# Patient Record
Sex: Female | Born: 1955 | Race: Black or African American | Hispanic: No | Smoking: Never smoker
Health system: Southern US, Community
[De-identification: ages and names within clinical notes are randomized; demographics above are authoritative.]

## PROBLEM LIST (undated history)

## (undated) DIAGNOSIS — I10 Essential (primary) hypertension: Secondary | ICD-10-CM

## (undated) DIAGNOSIS — E119 Type 2 diabetes mellitus without complications: Secondary | ICD-10-CM

---

## 1998-07-20 DIAGNOSIS — E119 Type 2 diabetes mellitus without complications: Secondary | ICD-10-CM | POA: Insufficient documentation

## 1998-07-20 DIAGNOSIS — I10 Essential (primary) hypertension: Secondary | ICD-10-CM | POA: Insufficient documentation

## 2006-07-20 DIAGNOSIS — F319 Bipolar disorder, unspecified: Secondary | ICD-10-CM | POA: Insufficient documentation

## 2008-06-25 DIAGNOSIS — J984 Other disorders of lung: Secondary | ICD-10-CM | POA: Insufficient documentation

## 2008-06-29 ENCOUNTER — Emergency Department (HOSPITAL_COMMUNITY): Admission: EM | Admit: 2008-06-29 | Discharge: 2008-06-29 | Payer: Self-pay | Admitting: Emergency Medicine

## 2008-06-29 LAB — CONVERTED CEMR LAB
Calcium: 1.21 mg/dL
Glucose, Bld: 221 mg/dL
HCT: 43.2 %
Hemoglobin: 14.1 g/dL
Monocytes Absolute: 0.4 10*3/uL
Neutro Abs: 3 10*3/uL
Neutrophils Relative %: 64 %
RBC: 5.01 M/uL
RDW: 15.7 %

## 2008-07-25 ENCOUNTER — Encounter (INDEPENDENT_AMBULATORY_CARE_PROVIDER_SITE_OTHER): Payer: Self-pay | Admitting: Nurse Practitioner

## 2008-07-26 ENCOUNTER — Ambulatory Visit: Payer: Self-pay | Admitting: Nurse Practitioner

## 2008-07-26 DIAGNOSIS — G5790 Unspecified mononeuropathy of unspecified lower limb: Secondary | ICD-10-CM | POA: Insufficient documentation

## 2008-07-26 DIAGNOSIS — L80 Vitiligo: Secondary | ICD-10-CM | POA: Insufficient documentation

## 2008-07-26 LAB — CONVERTED CEMR LAB: Blood Glucose, Fingerstick: 171

## 2008-08-03 ENCOUNTER — Telehealth (INDEPENDENT_AMBULATORY_CARE_PROVIDER_SITE_OTHER): Payer: Self-pay | Admitting: *Deleted

## 2008-08-10 ENCOUNTER — Ambulatory Visit: Payer: Self-pay | Admitting: *Deleted

## 2008-09-18 ENCOUNTER — Telehealth (INDEPENDENT_AMBULATORY_CARE_PROVIDER_SITE_OTHER): Payer: Self-pay | Admitting: Nurse Practitioner

## 2008-10-10 ENCOUNTER — Ambulatory Visit: Payer: Self-pay | Admitting: Nurse Practitioner

## 2008-10-10 LAB — CONVERTED CEMR LAB
Bilirubin Urine: NEGATIVE
Nitrite: NEGATIVE
Specific Gravity, Urine: >=1.03
Urobilinogen, UA: NEGATIVE

## 2008-10-16 ENCOUNTER — Encounter (INDEPENDENT_AMBULATORY_CARE_PROVIDER_SITE_OTHER): Payer: Self-pay | Admitting: Nurse Practitioner

## 2008-10-16 DIAGNOSIS — A5601 Chlamydial cystitis and urethritis: Secondary | ICD-10-CM | POA: Insufficient documentation

## 2008-10-16 LAB — CONVERTED CEMR LAB
Basophils Relative: 0 % (ref 0–1)
Chlamydia, DNA Probe: POSITIVE — AB
Cholesterol: 220 mg/dL — ABNORMAL HIGH (ref 0–200)
Eosinophils Relative: 5 % (ref 0–5)
GC Probe Amp, Genital: NEGATIVE
HCT: 40.1 % (ref 36.0–46.0)
HDL: 52 mg/dL (ref >39)
Hemoglobin: 13.4 g/dL (ref 12.0–15.0)
Lymphocytes Relative: 29 % (ref 12–46)
Lymphs Abs: 1.5 10*3/uL (ref 0.7–4.0)
MCHC: 33.4 g/dL (ref 30.0–36.0)
Neutro Abs: 3 10*3/uL (ref 1.7–7.7)
Neutrophils Relative %: 58 % (ref 43–77)
RBC: 4.85 M/uL (ref 3.87–5.11)
Total CHOL/HDL Ratio: 4.2
Total Protein: 8.6 g/dL — ABNORMAL HIGH (ref 6.0–8.3)
VLDL: 20 mg/dL (ref 0–40)

## 2009-04-15 ENCOUNTER — Ambulatory Visit: Payer: Self-pay | Admitting: Nurse Practitioner

## 2009-04-15 DIAGNOSIS — R141 Gas pain: Secondary | ICD-10-CM | POA: Insufficient documentation

## 2009-04-15 DIAGNOSIS — R142 Eructation: Secondary | ICD-10-CM

## 2009-04-15 DIAGNOSIS — M545 Low back pain, unspecified: Secondary | ICD-10-CM | POA: Insufficient documentation

## 2009-04-15 DIAGNOSIS — R143 Flatulence: Secondary | ICD-10-CM

## 2009-04-15 DIAGNOSIS — Z8639 Personal history of other endocrine, nutritional and metabolic disease: Secondary | ICD-10-CM

## 2009-04-15 DIAGNOSIS — L84 Corns and callosities: Secondary | ICD-10-CM | POA: Insufficient documentation

## 2009-04-15 DIAGNOSIS — F341 Dysthymic disorder: Secondary | ICD-10-CM | POA: Insufficient documentation

## 2009-04-15 DIAGNOSIS — Z862 Personal history of diseases of the blood and blood-forming organs and certain disorders involving the immune mechanism: Secondary | ICD-10-CM | POA: Insufficient documentation

## 2009-04-15 DIAGNOSIS — E7800 Pure hypercholesterolemia, unspecified: Secondary | ICD-10-CM | POA: Insufficient documentation

## 2009-04-15 LAB — CONVERTED CEMR LAB
BUN: 14 mg/dL (ref 6–23)
Bilirubin Urine: NEGATIVE
Blood Glucose, Fingerstick: 204
Blood in Urine, dipstick: NEGATIVE
CA 125: 3 units/mL (ref 0.0–30.2)
CO2: 27 meq/L (ref 19–32)
Chlamydia, Swab/Urine, PCR: NEGATIVE
Chloride: 104 meq/L (ref 96–112)
Cholesterol, target level: 200 mg/dL
Glucose, Bld: 213 mg/dL — ABNORMAL HIGH (ref 70–99)
Glucose, Urine, Semiquant: NEGATIVE
HCV Ab: NEGATIVE
HDL goal, serum: 40 mg/dL
Hep A Total Ab: NEGATIVE
Hep B E Ab: NEGATIVE
Ketones, urine, test strip: NEGATIVE
LDL Goal: 100 mg/dL
Nitrite: NEGATIVE
Potassium: 4.8 meq/L (ref 3.5–5.3)
Sodium: 142 meq/L (ref 135–145)
Specific Gravity, Urine: >=1.03
Total Bilirubin: 0.6 mg/dL (ref 0.3–1.2)
Urobilinogen, UA: 1
VLDL: 16 mg/dL (ref 0–40)
WBC Urine, dipstick: NEGATIVE
pH: 5

## 2009-04-18 ENCOUNTER — Encounter (INDEPENDENT_AMBULATORY_CARE_PROVIDER_SITE_OTHER): Payer: Self-pay | Admitting: Nurse Practitioner

## 2009-04-29 ENCOUNTER — Ambulatory Visit: Payer: Self-pay | Admitting: Nurse Practitioner

## 2009-05-09 ENCOUNTER — Telehealth (INDEPENDENT_AMBULATORY_CARE_PROVIDER_SITE_OTHER): Payer: Self-pay | Admitting: Nurse Practitioner

## 2009-05-22 ENCOUNTER — Encounter (INDEPENDENT_AMBULATORY_CARE_PROVIDER_SITE_OTHER): Payer: Self-pay | Admitting: *Deleted

## 2009-05-24 ENCOUNTER — Ambulatory Visit: Payer: Self-pay | Admitting: Nurse Practitioner

## 2009-05-24 DIAGNOSIS — R252 Cramp and spasm: Secondary | ICD-10-CM | POA: Insufficient documentation

## 2009-07-23 ENCOUNTER — Ambulatory Visit: Payer: Self-pay | Admitting: Nurse Practitioner

## 2009-07-23 LAB — CONVERTED CEMR LAB
Blood Glucose, Fingerstick: 252
Hgb A1c MFr Bld: 7.6 % — ABNORMAL HIGH (ref 4.6–6.1)

## 2009-07-25 ENCOUNTER — Encounter (INDEPENDENT_AMBULATORY_CARE_PROVIDER_SITE_OTHER): Payer: Self-pay | Admitting: Nurse Practitioner

## 2009-08-29 ENCOUNTER — Ambulatory Visit: Payer: Self-pay | Admitting: Nurse Practitioner

## 2009-09-19 ENCOUNTER — Encounter (INDEPENDENT_AMBULATORY_CARE_PROVIDER_SITE_OTHER): Payer: Self-pay | Admitting: Nurse Practitioner

## 2009-11-19 ENCOUNTER — Ambulatory Visit: Payer: Self-pay | Admitting: Nurse Practitioner

## 2009-11-19 LAB — CONVERTED CEMR LAB
Bilirubin Urine: NEGATIVE
Blood Glucose, Fingerstick: 258
Glucose, Urine, Semiquant: 250
Hgb A1c MFr Bld: 10.1 %
OCCULT 1: NEGATIVE
Specific Gravity, Urine: >=1.03

## 2009-11-22 ENCOUNTER — Encounter (INDEPENDENT_AMBULATORY_CARE_PROVIDER_SITE_OTHER): Payer: Self-pay | Admitting: Nurse Practitioner

## 2009-11-22 LAB — CONVERTED CEMR LAB: Pap Smear: NEGATIVE

## 2009-12-04 ENCOUNTER — Ambulatory Visit: Payer: Self-pay | Admitting: Nurse Practitioner

## 2009-12-05 ENCOUNTER — Telehealth (INDEPENDENT_AMBULATORY_CARE_PROVIDER_SITE_OTHER): Payer: Self-pay | Admitting: Nurse Practitioner

## 2009-12-05 ENCOUNTER — Encounter (INDEPENDENT_AMBULATORY_CARE_PROVIDER_SITE_OTHER): Payer: Self-pay | Admitting: *Deleted

## 2009-12-12 ENCOUNTER — Encounter (INDEPENDENT_AMBULATORY_CARE_PROVIDER_SITE_OTHER): Payer: Self-pay | Admitting: Nurse Practitioner

## 2009-12-12 LAB — CONVERTED CEMR LAB
Albumin: 4.5 g/dL (ref 3.5–5.2)
Alkaline Phosphatase: 89 units/L (ref 39–117)
BUN: 14 mg/dL (ref 6–23)
Basophils Absolute: 0 10*3/uL (ref 0.0–0.1)
Basophils Relative: 0 % (ref 0–1)
CO2: 25 meq/L (ref 19–32)
Eosinophils Relative: 5 % (ref 0–5)
Glucose, Bld: 264 mg/dL — ABNORMAL HIGH (ref 70–99)
HCT: 42.7 % (ref 36.0–46.0)
Hemoglobin: 13.6 g/dL (ref 12.0–15.0)
Lymphocytes Relative: 21 % (ref 12–46)
MCHC: 31.9 g/dL (ref 30.0–36.0)
Microalb, Ur: 18.69 mg/dL — ABNORMAL HIGH (ref 0.00–1.89)
Monocytes Absolute: 0.4 10*3/uL (ref 0.1–1.0)
Monocytes Relative: 6 % (ref 3–12)
Neutro Abs: 3.7 10*3/uL (ref 1.7–7.7)
Potassium: 4.1 meq/L (ref 3.5–5.3)
RBC: 4.95 M/uL (ref 3.87–5.11)
RDW: 16.4 % — ABNORMAL HIGH (ref 11.5–15.5)
Sodium: 135 meq/L (ref 135–145)
TSH: 3.523 microintl units/mL (ref 0.350–4.500)
Total Protein: 8.8 g/dL — ABNORMAL HIGH (ref 6.0–8.3)

## 2009-12-19 ENCOUNTER — Encounter (INDEPENDENT_AMBULATORY_CARE_PROVIDER_SITE_OTHER): Payer: Self-pay | Admitting: Nurse Practitioner

## 2010-01-02 ENCOUNTER — Ambulatory Visit: Payer: Self-pay | Admitting: Nurse Practitioner

## 2010-01-04 IMAGING — CR DG CHEST 2V
2 series · 2 of 2 positions shown · non-contrast
Comparison: None available.

CLINICAL DATA: Shortness of breath.

CHEST - 2 VIEW

[w chest pa]
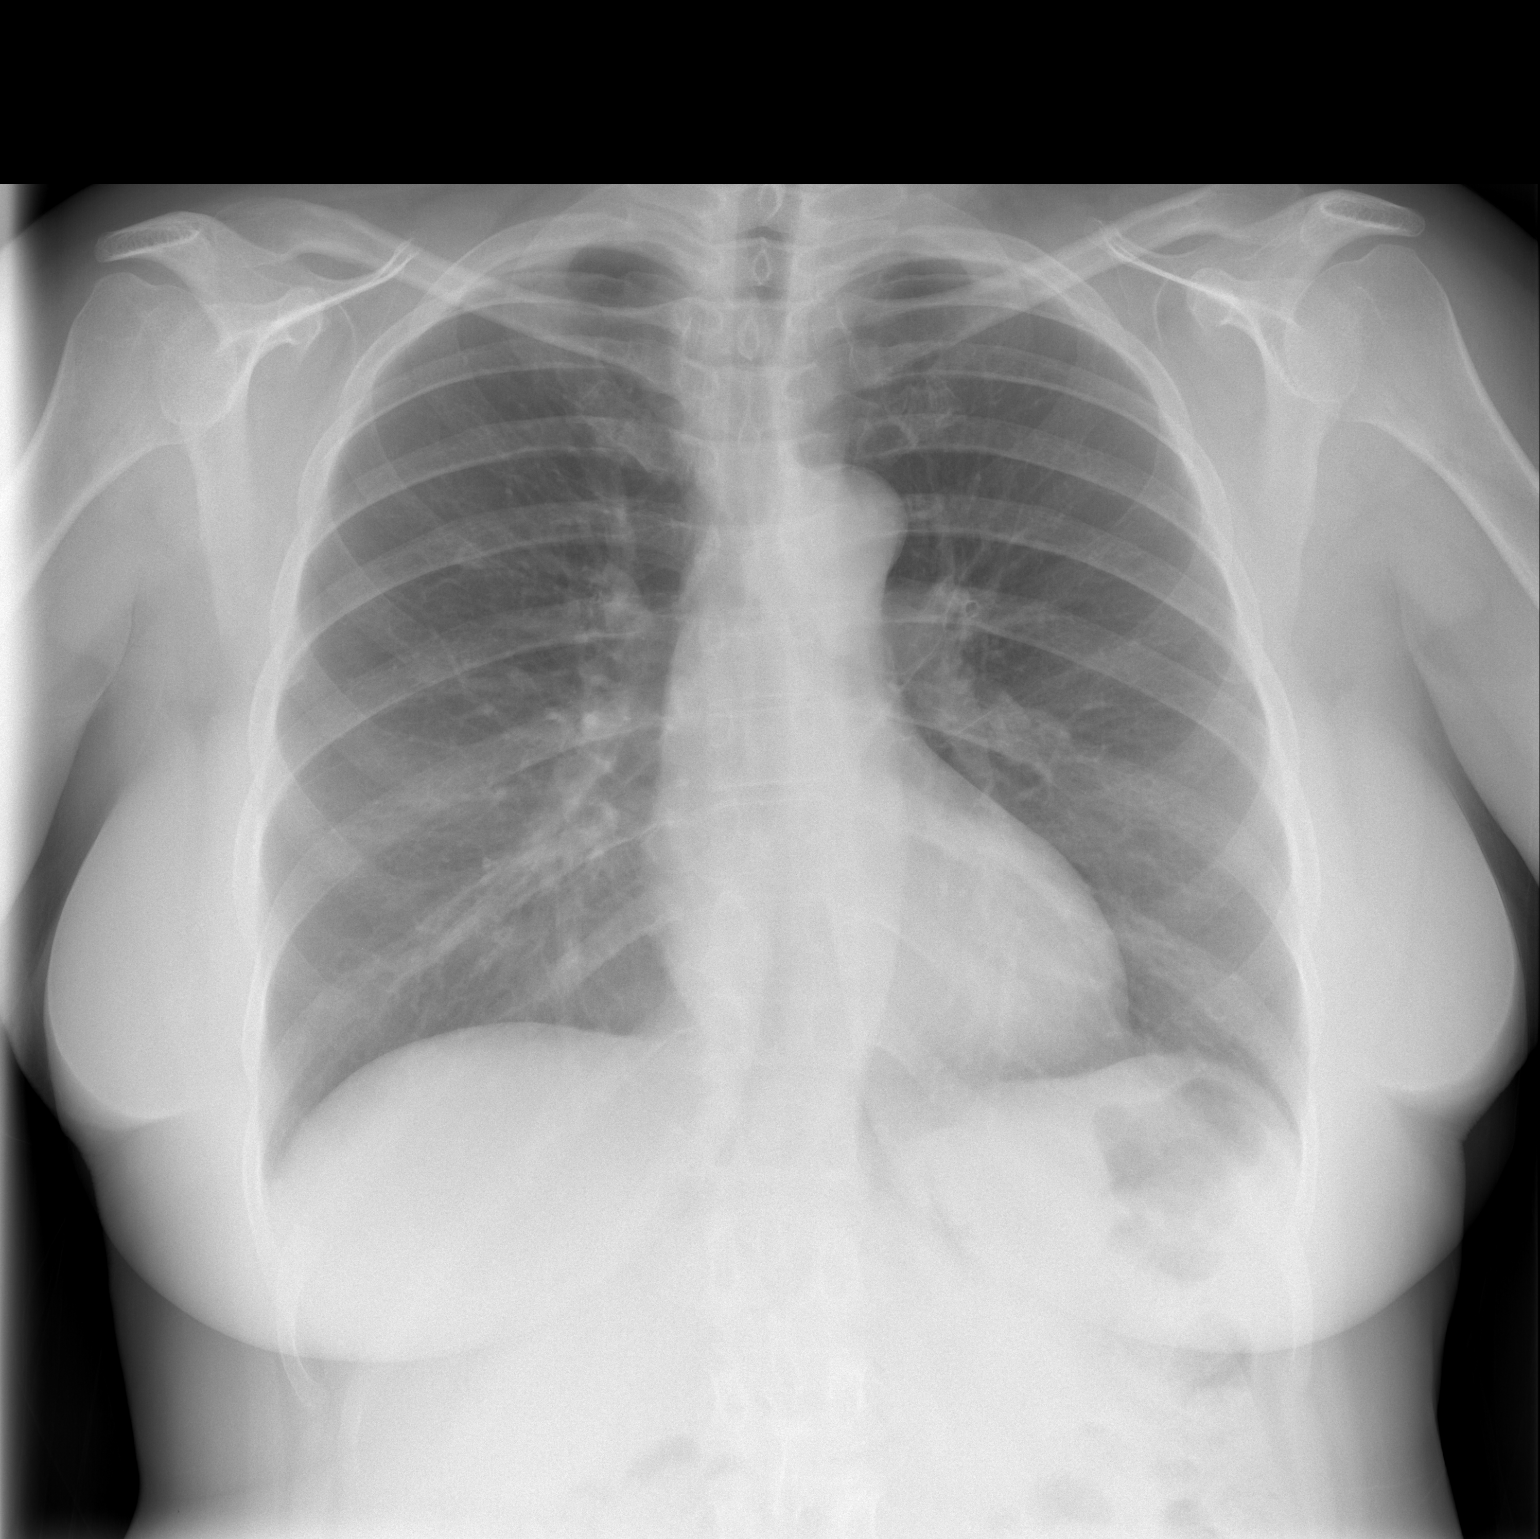

[w chest lat]
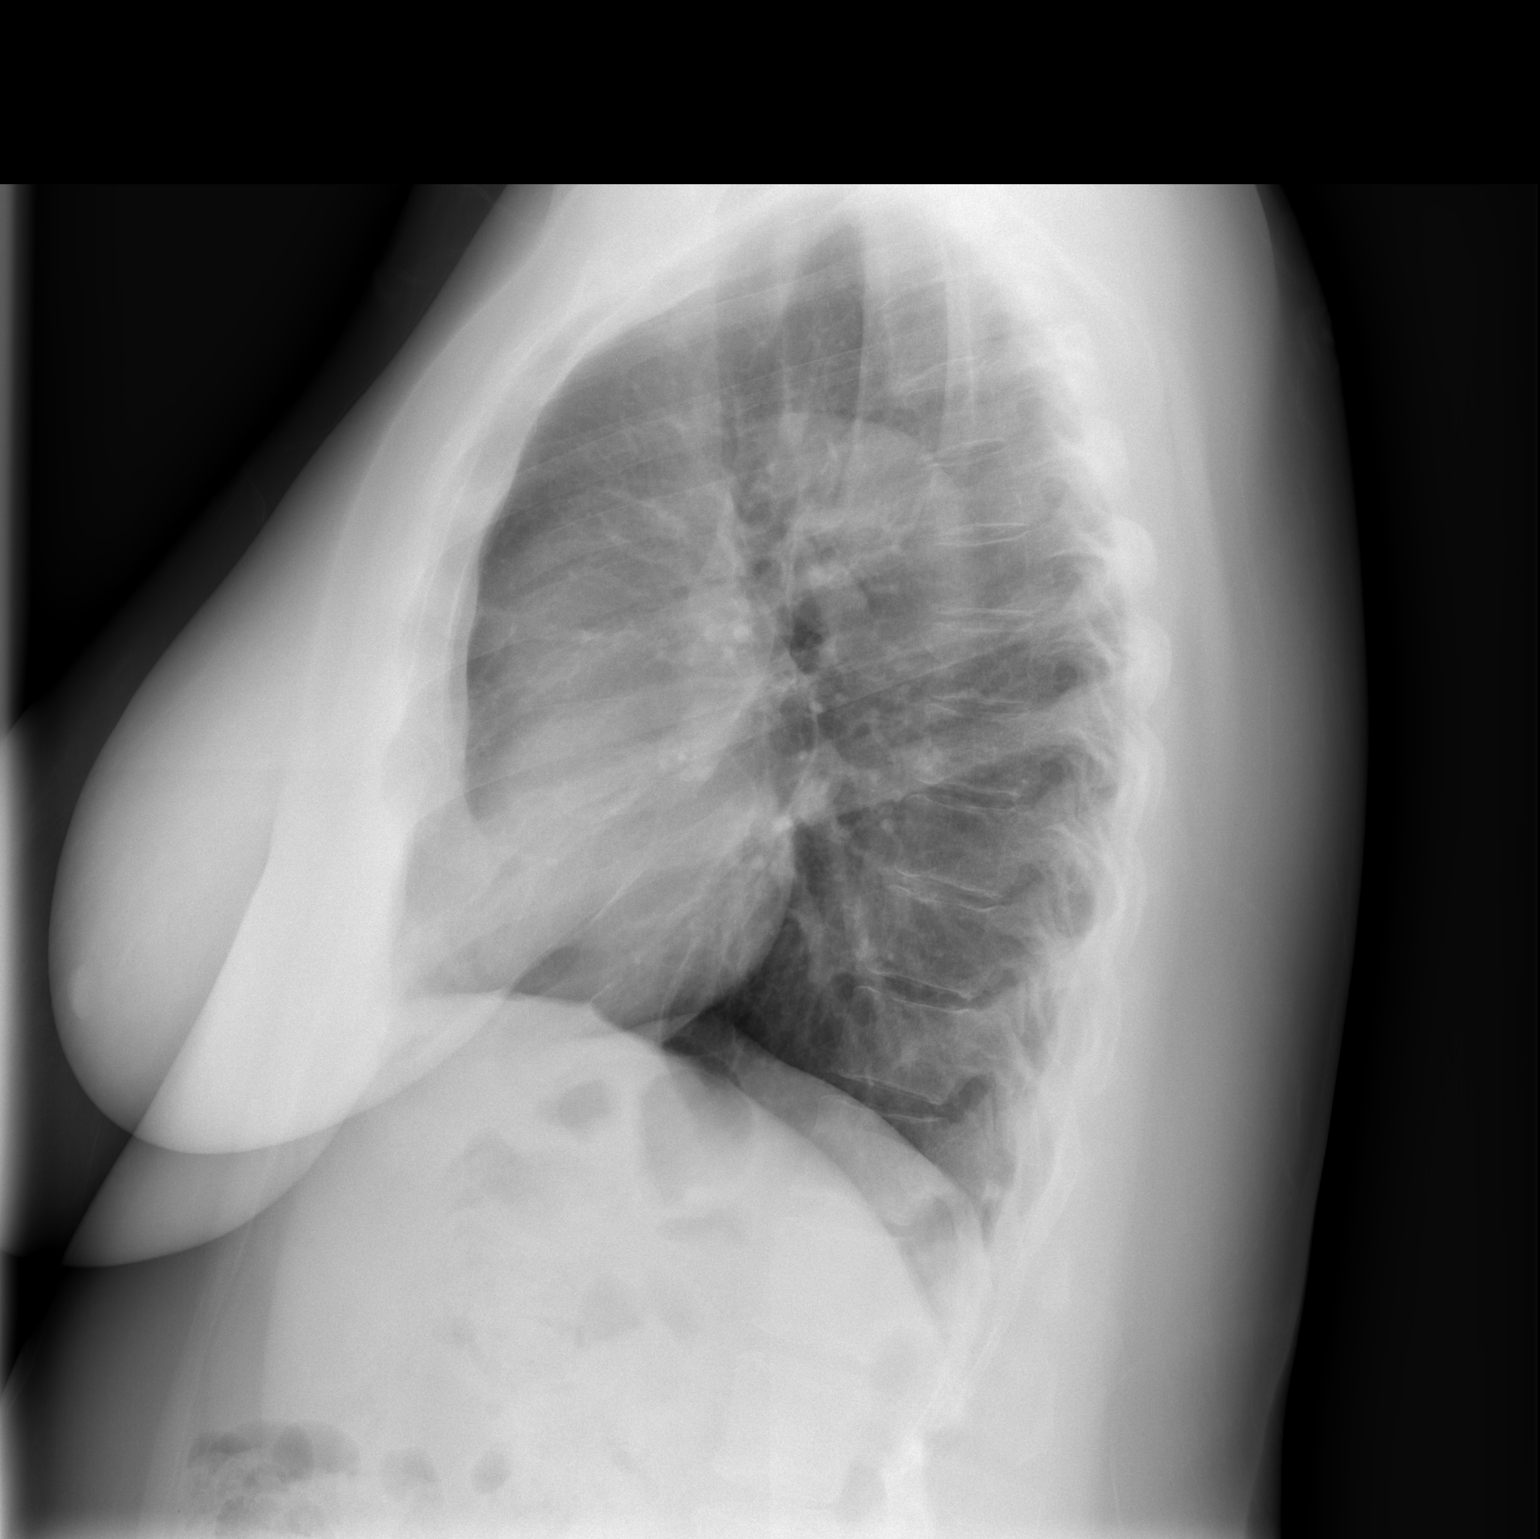

[2 of 2 positions shown; findings below may reference images not displayed]

FINDINGS: The cardiopericardial silhouette is within normal limits
for size.  A 7 mm nodular densities seen in the right upper lobe.
This is not definitively seen on the lateral.  It is at the overlap
of the posterior left 6th  and anterior right 3rd ribs.  This could
be superimposition of shadows.  No other focal nodule or airspace
disease is evident.  The visualized soft tissues and bony thorax
are unremarkable.
IMPRESSION: 1.  Nodular density right upper lobe.  This may be artifactual.
Bilateral oblique views of the chest could be used for further
evaluation if clinically indicated.
2.  No acute cardiopulmonary disease.

## 2010-01-15 ENCOUNTER — Encounter (INDEPENDENT_AMBULATORY_CARE_PROVIDER_SITE_OTHER): Payer: Self-pay | Admitting: Nurse Practitioner

## 2010-02-03 ENCOUNTER — Ambulatory Visit: Payer: Self-pay | Admitting: Nurse Practitioner

## 2010-02-17 ENCOUNTER — Telehealth (INDEPENDENT_AMBULATORY_CARE_PROVIDER_SITE_OTHER): Payer: Self-pay | Admitting: Nurse Practitioner

## 2010-03-07 ENCOUNTER — Encounter (INDEPENDENT_AMBULATORY_CARE_PROVIDER_SITE_OTHER): Payer: Self-pay | Admitting: Nurse Practitioner

## 2010-04-14 ENCOUNTER — Encounter (INDEPENDENT_AMBULATORY_CARE_PROVIDER_SITE_OTHER): Payer: Self-pay | Admitting: Nurse Practitioner

## 2010-06-02 ENCOUNTER — Encounter (INDEPENDENT_AMBULATORY_CARE_PROVIDER_SITE_OTHER): Payer: Self-pay | Admitting: Nurse Practitioner

## 2010-06-03 ENCOUNTER — Encounter (INDEPENDENT_AMBULATORY_CARE_PROVIDER_SITE_OTHER): Payer: Self-pay | Admitting: *Deleted

## 2010-08-10 ENCOUNTER — Encounter: Payer: Self-pay | Admitting: Internal Medicine

## 2010-08-19 NOTE — Assessment & Plan Note (Signed)
Summary: BP CK PER [facility] MARTIN / NS  Nurse Visit   Vital Signs:  Patient profile:   55 year old female Menstrual status:  postmenopausal Pulse rate:   93 / minute BP sitting:   142 / 94  (right arm)  Allergies: No Known Drug Allergies  Orders Added: 1)  Est. Patient Level I [59563]

## 2010-08-19 NOTE — Miscellaneous (Signed)
Summary: Retasure results   Diabetes Management History:      The patient is a 55 years old female who comes in for evaluation of DM Type 2.  She has not been enrolled in the "Diabetic Education Program".  She is checking home blood sugars.  She says that she is exercising.  Type of exercise includes: aerobics/walking.    Diabetes Management Exam:    Eye Exam:       Eye Exam done elsewhere          Date: 12/04/2009          Results: normal          Done by: Retasure  Diabetes Management Assessment/Plan:      The following lipid goals have been established for the patient: Total cholesterol goal of 200; LDL cholesterol goal of 100; HDL cholesterol goal of 40; Triglyceride goal of 150.  Her blood pressure goal is < 130/80.   Clinical Lists Changes  Observations: Added new observation of EYES COMMENT: 12/2010 (01/15/2010 8:27) Added new observation of EYE EXAM BY: Retasure (12/04/2009 8:27) Added new observation of DMEYEEXMRES: normal (12/04/2009 8:27) Added new observation of DIAB EYE EX: normal (12/04/2009 8:27)

## 2010-08-19 NOTE — Letter (Signed)
Summary: *HSN Results Follow up  [facility]  [address] [place], [place] [address]   Phone: [phone]  Fax: [phone]      07/25/2009   Melanie French [address] [place], [place]  [address]   Dear  Ms. [name],                            ____S.Drinkard,FNP   ____D. Gore,FNP       ____B. McPherson,MD   ____V. Rankins,MD    ____E. Mulberry,MD    __X__N. Daphine Deutscher, FNP  ____D. Reche Dixon, MD    ____K. Philipp Deputy, MD    ____Other     This letter is to inform you that your recent test(s):  _______Pap Smear    ____X___Lab Test     _______X-ray     Comments: Your 3 month blood sugar test values was 7.6. This is the same thing it was 3 months ago which means your blood sugar is no better but it is no worse. Remember the goal is less than 7.  Keep trying to manage your diet, exercise and medication.       _________________________________________________________ If you have any questions, please contact our office [phone].                    Sincerely,    Lehman Prom FNP [facility]

## 2010-08-19 NOTE — Assessment & Plan Note (Signed)
Summary: Diabetes/HTN   Vital Signs:  Patient profile:   55 year old female Menstrual status:  postmenopausal Weight:      173.7 pounds Temp:     98.1 degrees F oral Pulse rate:   86 / minute Pulse rhythm:   regular Resp:     18 per minute BP sitting:   214 / 119  (left arm) Cuff size:   regular  Vitals Entered By: Gaylyn Cheers RN (July 23, 2009 8:53 AM)  Serial Vital Signs/Assessments:  Time      Position  BP       Pulse  Resp  Temp     By 10:14 AM  R Arm     187/124                        Levon Hedger  CC: F/U DM, HTN, feeling tired and depressed, S.O.B. and dizziness @ intervals denies HA's, nausea, missed podiatry appt., Hypertension Management, Depression, Abdominal Pain Is Patient Diabetic? Yes Did you bring your meter with you today? No Pain Assessment Patient in pain? no      Nutritional Status Detail non fasting CBG Result 252 CBG Device ID A  Does patient need assistance? Functional Status Self care Ambulation Normal Comments currently not taking IBUPROFEN 800 MG TABS (IBUPROFEN) One tablet by mouth three times a day as needed for pain NORTRIPTYLINE HCL 10 MG CAPS (NORTRIPTYLINE HCL) One capsule by mouth nightly as needed for leg cramps or ASA 81 mg   CC:  F/U DM, HTN, feeling tired and depressed, S.O.B. and dizziness @ intervals denies HA's, nausea, missed podiatry appt., Hypertension Management, Depression, and Abdominal Pain.  History of Present Illness:  Pt into the office for follow up on diabetes and htn.  Diabetes - Pt is checking her blood sugar once a day before meals. She reports she is taking her meds as ordered.  Depression History:      The patient is having a depressed mood most of the day and has a diminished interest in her usual daily activities.  Positive alarm features for depression include insomnia and fatigue (loss of energy).  However, she denies recurrent thoughts of death or suicide.        Psychosocial stress factors  include major life changes.  The patient denies that she feels like life is not worth living, denies that she wishes that she were dead, and denies that she has thought about ending her life.  Due to her current symptoms, it often takes extra effort to do the things she needs to do.        Comments:  Pt has been going [facility] a [facility]. She has been having increased stressors at work.   Pt has been on medications in the past which included abilify.  She has a dx of chronic depression and bipolar.  No meds in the past year.  Dyspepsia History:      There is no prior history of GERD.  The patient does not have a prior history of documented ulcer disease.  The dominant symptom is not heartburn or acid reflux.  An H-2 blocker medication is not currently being taken.  She has no history of a positive H. Pylori serology.  No previous upper endoscopy has been done.    Hypertension History:      She denies headache, chest pain, and palpitations.  She notes no problems with any antihypertensive  medication side effects.  Pt is taking avapro 150mg  by mouth twice daily.  She is also taking norvasc 5mg  by mouth (1/2 tablet) by mouth daily.  Pt was supposed to increase this to 1 tablet after one week following her last visit but she did not.  Further comments include: She has taken her BP meds today. No BP checks at home.        Positive major cardiovascular risk factors include female age 41 years old or older, diabetes, hyperlipidemia, hypertension, and family history for ischemic heart disease (females less than 39 years old).  Negative major cardiovascular risk factors include non-tobacco-user status.        Further assessment for target organ damage reveals no history of ASHD, cardiac end-organ damage (CHF/LVH), stroke/TIA, peripheral vascular disease, renal insufficiency, or hypertensive retinopathy.       Current Medications (verified): 1)  Glucophage 1000 Mg Tabs  (Metformin Hcl) .Marland Kitchen.. 1 Tablet By Mouth Two Times A Day Before Breakfast and Dinner 2)  Bayer Low Strength 81 Mg Tbec (Aspirin) .Marland Kitchen.. 1 Tablet By Mouth Daily For Circulation 3)  Avapro 150 Mg Tabs (Irbesartan) .Marland Kitchen.. 1 Tablet By Mouth Two Times A Day For Blood Pressure 4)  Apap 500 Mg Tabs (Acetaminophen) .... 2 Tablets By Mouth Daily As Needed For Pain 5)  Glucometer Elite Classic  Kit (Blood Glucose Monitoring Suppl) .... Dispense Meter To Check Blood Sugar 6)  Freestyle Lancets  Misc (Lancets) .... Use To Check Blood Sugar Twice Daily 7)  Glucotrol Xl 10 Mg Xr24h-Tab (Glipizide) .Marland Kitchen.. 1 Tablet By Mouth Daily For Blood Pressure 8)  Pravastatin Sodium 20 Mg Tabs (Pravastatin Sodium) .... Hold 9)  Norvasc 5 Mg Tabs (Amlodipine Besylate) .... One Tablet By Mouth Daily For Blood Pressure 10)  Nexium 40 Mg Cpdr (Esomeprazole Magnesium) .... One Tablet By Mouth Daily 30 Minutes Before Meals For Stomach 11)  Ibuprofen 800 Mg Tabs (Ibuprofen) .... One Tablet By Mouth Three Times A Day As Needed For Pain 12)  Nortriptyline Hcl 10 Mg Caps (Nortriptyline Hcl) .... One Capsule By Mouth Nightly As Needed For Leg Cramps  Allergies (verified): No Known Drug Allergies  Review of Systems General:  Denies fever; dizziness at times. CV:  Complains of shortness of breath with exertion; denies chest pain or discomfort. Resp:  Denies cough. GI:  Denies abdominal pain, nausea, and vomiting.  Physical Exam  General:  alert.   Head:  normocephalic.   Lungs:  normal breath sounds.   Heart:  normal rate and regular rhythm.   Abdomen:  normal bowel sounds.   Msk:  up to the exam table Neurologic:  alert & oriented X3.   Skin:  discoloration - hypopigmented areas Psych:  Oriented X3.     Impression & Recommendations:  Problem # 1:  DIABETES MELLITUS (ICD-250.00) Advised pt to check for retasure for eye screening will heck Hgba1c Her updated medication list for this problem includes:    Glucophage 1000 Mg  Tabs (Metformin hcl) .Marland Kitchen... 1 tablet by mouth two times a day before breakfast and dinner    Bayer Low Strength 81 Mg Tbec (Aspirin) .Marland Kitchen... 1 tablet by mouth daily for circulation    Avapro 150 Mg Tabs (Irbesartan) .Marland Kitchen... 1 tablet by mouth two times a day for blood pressure    Glucotrol Xl 10 Mg Xr24h-tab (Glipizide) .Marland Kitchen... 1 tablet by mouth daily for blood pressure  Orders: Capillary Blood Glucose/CBG (82948) T- Hemoglobin A1C ([address]-56387)  Problem # 2:  HYPERTENSION, BENIGN ESSENTIAL (  ICD-401.1) VERY elevated today Clonidine 0.1mg  by mouth x 1 dose given in office Her updated medication list for this problem includes:    Avapro 150 Mg Tabs (Irbesartan) .Marland Kitchen... 1 tablet by mouth two times a day for blood pressure    Norvasc 5 Mg Tabs (Amlodipine besylate) ..... One tablet by mouth daily for blood pressure  Problem # 3:  ANXIETY DEPRESSION (ICD-300.4) Pt to continue her follow up at [facility]  Schedule an appointment with Marchelle Folks  pt will likely need to start meds  Problem # 4:  HYPERCHOLESTEROLEMIA (ICD-272.0)  Her updated medication list for this problem includes:    Pravastatin Sodium 20 Mg Tabs (Pravastatin sodium) ..... One tablet by mouth nightly for cholesterol  Complete Medication List: 1)  Glucophage 1000 Mg Tabs (Metformin hcl) .Marland Kitchen.. 1 tablet by mouth two times a day before breakfast and dinner 2)  Bayer Low Strength 81 Mg Tbec (Aspirin) .Marland Kitchen.. 1 tablet by mouth daily for circulation 3)  Avapro 150 Mg Tabs (Irbesartan) .Marland Kitchen.. 1 tablet by mouth two times a day for blood pressure 4)  Apap 500 Mg Tabs (Acetaminophen) .... 2 tablets by mouth daily as needed for pain 5)  Glucometer Elite Classic Kit (Blood glucose monitoring suppl) .... Dispense meter to check blood sugar 6)  Freestyle Lancets Misc (Lancets) .... Use to check blood sugar twice daily 7)  Glucotrol Xl 10 Mg Xr24h-tab (Glipizide) .Marland Kitchen.. 1 tablet by mouth daily for blood pressure 8)  Pravastatin Sodium 20 Mg Tabs  (Pravastatin sodium) .... One tablet by mouth nightly for cholesterol 9)  Norvasc 5 Mg Tabs (Amlodipine besylate) .... One tablet by mouth daily for blood pressure 10)  Nexium 40 Mg Cpdr (Esomeprazole magnesium) .... One tablet by mouth daily 30 minutes before meals for stomach 11)  Ibuprofen 800 Mg Tabs (Ibuprofen) .... One tablet by mouth three times a day as needed for pain 12)  Nortriptyline Hcl 10 Mg Caps (Nortriptyline hcl) .... One capsule by mouth nightly as needed for leg cramps  Hypertension Assessment/Plan:      The patient's hypertensive risk group is category C: Target organ damage and/or diabetes.  Her calculated 10 year risk of coronary heart disease is 27 %.  Today's blood pressure is 214/119.  Her blood pressure goal is < 130/80.  Patient Instructions: 1)  Schedule an appointment with Aquilla Solian within the next week. 2)  follow up in 2 weeks for a blood pressure check. 3)  Take your medications before this visit. Clarify if pt is taking cholesterol medications 4)  Reschedule appointment with podiatry at [facility]. 5)  Schedule appointment for a complete physical exam in March 2011. Prescriptions: PRAVASTATIN SODIUM 20 MG TABS (PRAVASTATIN SODIUM) One tablet by mouth nightly for cholesterol  #30 x 5   Entered and Authorized by:   Lehman Prom FNP   Signed by:   Lehman Prom FNP on 07/23/2009   Method used:   Faxed to ...       [facility] (retail)       [address] [place], [place]  [address]       Ph: [phone]       Fax: [phone]   RxID:   [id number]   Prevention & Chronic Care Immunizations   Influenza vaccine: Refused  (04/15/2009)   Influenza vaccine deferral: Refused  (07/23/2009)    Tetanus booster: 07/20/2002: historical per pt    Pneumococcal vaccine: Not documented  Pneumococcal vaccine deferral: Refused  (07/23/2009)  Colorectal Screening   Hemoccult: Not documented     Colonoscopy: Not documented  Other Screening   Pap smear:  Specimen Adequacy: Satisfactory for evaluation.   Interpretation/Result:Negative for intraepithelial Lesion or Malignancy.   Interpretation/Result:Atrophy.     (10/10/2008)    Mammogram: Not documented   Smoking status: never  (04/15/2009)  Diabetes Mellitus   HgbA1C: 7.6  (04/15/2009)    Eye exam: Not documented    Foot exam: yes  (04/15/2009)   High risk foot: Not documented   Foot care education: Not documented    Urine microalbumin/creatinine ratio: Not documented  Lipids   Total Cholesterol: 198  (04/15/2009)   LDL: 133  (04/15/2009)   LDL Direct: Not documented   HDL: 49  (04/15/2009)   Triglycerides: 79  (04/15/2009)    SGOT (AST): 31  (04/15/2009)   SGPT (ALT): 24  (04/15/2009)   Alkaline phosphatase: 67  (04/15/2009)   Total bilirubin: 0.6  (04/15/2009)  Hypertension   Last Blood Pressure: 214 / 119  (07/23/2009)   Serum creatinine: 0.89  (04/15/2009)   Serum potassium 4.8  (04/15/2009)    Hypertension flowsheet reviewed?: Yes   Progress toward BP goal: Deteriorated  Self-Management Support :   Personal Goals (by the next clinic visit) :      Personal blood pressure goal: 140/90  (07/23/2009)   Patient will work on the following items until the next clinic visit to reach self-care goals:     Medications and monitoring: take my medicines every day, bring all of my medications to every visit  (07/23/2009)    Diabetes self-management support: Not documented    Hypertension self-management support: Not documented    Lipid self-management support: Not documented    Appended Document: Diabetes/HTN    Clinical Lists Changes  Orders: Added new Service order of Clonidine 0.1mg  tab [facility]) - Signed Added new Referral order of Misc. Referral (Misc. Ref) - Signed Observations: Added new observation of DBT FT EX DT: 07/23/2009 (07/23/2009 11:18) Added new observation of DBT FT EX BY: Levon Hedger (07/23/2009 11:18) Added new observation of MONOFILAM LF: normal (07/23/2009 11:18) Added new observation of FT INSPECT L: normal (07/23/2009 11:18) Added new observation of MONOFILAM RT: normal (07/23/2009 11:18) Added new observation of FT INSPECT R: normal (07/23/2009 11:18)       Medication Administration  Medication # 1:    Medication: Clonidine 0.1mg  tab    Diagnosis: HYPERTENSION, BENIGN ESSENTIAL (ICD-401.1)    Dose: 1 tablet    Route: po    Exp Date: 06/2010    Lot #: [id number]    Mfr: mylan pharmacueticals    Patient tolerated medication without complications    Given by: Levon Hedger (July 23, 2009 11:33 AM)  Orders Added: 1)  Clonidine 0.1mg  tab [EMRORAL] 2)  Misc. Referral [Misc. Ref]   Last LDL:                                                 133 (04/15/2009 10:53:00 PM)        Diabetic Foot Exam    10-g (5.07) Semmes-Weinstein Monofilament Test Performed by: Levon Hedger          Right Foot          Left Foot Visual Inspection  Test Control      normal         normal Site 1         normal         normal Site 2         normal         normal Site 3         normal         normal Site 4         normal         normal Site 5         normal         normal Site 6         normal         normal Site 7         normal         normal Site 8         normal         normal Site 9         normal         normal Site 10         normal         normal  Impression      normal         normal

## 2010-08-19 NOTE — Letter (Signed)
Summary: Work Excuse  [facility]  [address] [place], [place] [address]   Phone: [phone]  Fax: [phone]    Today's Date: July 23, 2009  Name of Patient: Melanie French  The above named patient had a medical visit today   Please take this into consideration when reviewing the time away from work  Special Instructions:  [  ] None  [ X ] May return to work today after the office visit.  [  ] To be off until the next scheduled appointment on ______________________.  [  ] Other ________________________________________________________________ ________________________________________________________________________   Sincerely yours,   Lehman Prom FNP [place]

## 2010-08-19 NOTE — Letter (Signed)
Summary: MAILED REQUESTED RECORDS TO [facility]  MAILED REQUESTED RECORDS TO [facility]   Imported By: Arta Bruce 04/15/2010 12:15:22  _____________________________________________________________________  External Attachment:    Type:   Image     Comment:   External Document

## 2010-08-19 NOTE — Letter (Signed)
Summary: Handout Printed  Printed Handout:  - Diabetic Meal Planning Guide 

## 2010-08-19 NOTE — Assessment & Plan Note (Signed)
Summary: Depression/HTN   Vital Signs:  Patient profile:   55 year old female Menstrual status:  postmenopausal Weight:      173 pounds Temp:     98.0 degrees F Pulse rate:   92 / minute Pulse rhythm:   regular Resp:     18 per minute BP sitting:   170 / 95  (left arm) Cuff size:   regular  Vitals Entered By: Vesta Mixer CMA (January 02, 2010 8:40 AM) CC: f/u on lexapro, has not been crying and just has anxiety when she is around a lot of people, Hypertension Management, Depression, Lipid Management Is Patient Diabetic? Yes Pain Assessment Patient in pain? no      CBG Result 141  Does patient need assistance? Ambulation Normal   CC:  f/u on lexapro, has not been crying and just has anxiety when she is around a lot of people, Hypertension Management, Depression, and Lipid Management.  History of Present Illness:  Pt into the office to f/u on medications  Depression History:      The patient denies a depressed mood most of the day and a diminished interest in her usual daily activities.  The patient denies fatigue (loss of energy).        Risk factors for depression include a personal history of depression.  The patient denies that she feels like life is not worth living, denies that she wishes that she were dead, and denies that she has thought about ending her life.        Comments:  Pt was started on lexapro during the last visit. She is doing well.  Decreased crying spells.  Depression Treatment History:  Prior Medication Used:   Start Date: Assessment of Effect:   Comments:  lexapro     11/19/2009   much improvement     pt is doing well  Diabetes Management History:      The patient is a 55 years old female who comes in for evaluation of DM Type 2.  She has not been enrolled in the "Diabetic Education Program".  She states understanding of dietary principles and is following her diet appropriately.  Sensory loss is noted.  Self foot exams are not being performed.  She  is checking home blood sugars.  She says that she is exercising.  Type of exercise includes: aerobics/walking.        Hypoglycemic symptoms are not occurring.  No hyperglycemic symptoms are reported.    Hypertension History:      She denies headache, chest pain, and palpitations.  Pt has taken Avapro 150mg  by mouth daily however she is dosed 150mg  - 2 tablets by mouth daily. She usually takes one two times a day .        Positive major cardiovascular risk factors include female age 60 years old or older, diabetes, hyperlipidemia, hypertension, and family history for ischemic heart disease (females less than 62 years old).  Negative major cardiovascular risk factors include non-tobacco-user status.        Further assessment for target organ damage reveals no history of ASHD, cardiac end-organ damage (CHF/LVH), stroke/TIA, peripheral vascular disease, renal insufficiency, or hypertensive retinopathy.    Lipid Management History:      Positive NCEP/ATP III risk factors include female age 98 years old or older, diabetes, family history for ischemic heart disease (females less than 74 years old), and hypertension.  Negative NCEP/ATP III risk factors include non-tobacco-user status, no ASHD (atherosclerotic heart disease), no prior  stroke/TIA, no peripheral vascular disease, and no history of aortic aneurysm.        The patient states that she knows about the "Therapeutic Lifestyle Change" diet.  Her compliance with the TLC diet is fair.  Comments include: pt is not taking pravastatin as ordered.       Habits & Providers  Alcohol-Tobacco-Diet     Alcohol drinks/day: 0     Tobacco Status: never  Exercise-Depression-Behavior     Does Patient Exercise: yes     Exercise Counseling: not indicated; exercise is adequate     Type of exercise: aerobics/walking     Depression Counseling: further diagnostic testing and/or other treatment is indicated     Drug Use: no     Seat Belt Use: 100     Sun  Exposure: occasionally  Comments: walks daily in the morning  Allergies (verified): No Known Drug Allergies  Review of Systems Eyes:  Complains of blurring; pt did retasure screening at [address] but she has not recieved the results. CV:  Denies chest pain or discomfort. Resp:  Denies cough. GI:  Denies abdominal pain, nausea, and vomiting. Derm:  dx of vitiligo - c/o burning skin, not just in feet but in hypopigmented areas. Psych:  Denies depression.  Physical Exam  General:  alert.   Head:  normocephalic.   Ears:  ear piercing(s) noted.   Lungs:  normal breath sounds.   Heart:  normal rate and regular rhythm.   Neurologic:  alert & oriented X3.   Skin:  vitiligo Psych:  Oriented X3.     Impression & Recommendations:  Problem # 1:  HYPERTENSION, BENIGN ESSENTIAL (ICD-401.1) Blood pressure is still elevated Advisd pt to take the avapro 150 - 2 tablets in the morning Her updated medication list for this problem includes:    Avapro 150 Mg Tabs (Irbesartan) .Marland Kitchen... 1 tablet by mouth two times a day for blood pressure    Norvasc 10 Mg Tabs (Amlodipine besylate) ..... One tablet by mouth daily for blood pressure **note change in dose**  Problem # 2:  ANXIETY DEPRESSION (ICD-300.4) pt is doing well on the lexapro will refill med pt has also been to see Aquilla Solian as ordered  Problem # 3:  DIABETES MELLITUS (ICD-250.00) Meds increased on last visit  will recheck a1c on next visit - advised pt to stick to the diet Her updated medication list for this problem includes:    Janumet 50-1000 Mg Tabs (Sitagliptin-metformin hcl) ..... One tablet by mouth two times a day for blood sugar    Bayer Low Strength 81 Mg Tbec (Aspirin) .Marland Kitchen... 1 tablet by mouth daily for circulation    Avapro 150 Mg Tabs (Irbesartan) .Marland Kitchen... 1 tablet by mouth two times a day for blood pressure    Glucotrol Xl 10 Mg Xr24h-tab (Glipizide) .Marland Kitchen... 1 tablet by mouth daily for blood  pressure  Orders: Capillary Blood Glucose/CBG ([address])  Problem # 4:  VITILIGO (ICD-709.01) will refer to the dermatology clinic pt with C/o burning skin Orders: Dermatology Referral (Derma)  Complete Medication List: 1)  Janumet 50-1000 Mg Tabs (Sitagliptin-metformin hcl) .... One tablet by mouth two times a day for blood sugar 2)  Bayer Low Strength 81 Mg Tbec (Aspirin) .Marland Kitchen.. 1 tablet by mouth daily for circulation 3)  Avapro 150 Mg Tabs (Irbesartan) .Marland Kitchen.. 1 tablet by mouth two times a day for blood pressure 4)  Apap 500 Mg Tabs (Acetaminophen) .... 2 tablets by mouth daily as needed for pain  5)  Glucometer Elite Classic Kit (Blood glucose monitoring suppl) .... Dispense meter to check blood sugar 6)  Freestyle Lancets Misc (Lancets) .... Use to check blood sugar twice daily 7)  Glucotrol Xl 10 Mg Xr24h-tab (Glipizide) .Marland Kitchen.. 1 tablet by mouth daily for blood pressure 8)  Pravastatin Sodium 20 Mg Tabs (Pravastatin sodium) .... One tablet by mouth nightly for cholesterol 9)  Norvasc 10 Mg Tabs (Amlodipine besylate) .... One tablet by mouth daily for blood pressure **note change in dose** 10)  Nexium 40 Mg Cpdr (Esomeprazole magnesium) .... One tablet by mouth daily 30 minutes before meals for stomach 11)  Ibuprofen 800 Mg Tabs (Ibuprofen) .... One tablet by mouth three times a day as needed for pain 12)  Nortriptyline Hcl 10 Mg Caps (Nortriptyline hcl) .... One capsule by mouth nightly as needed for leg cramps 13)  Lexapro 10 Mg Tabs (Escitalopram oxalate) .... One tablet by mouth daily  Diabetes Management Assessment/Plan:      The following lipid goals have been established for the patient: Total cholesterol goal of 200; LDL cholesterol goal of 100; HDL cholesterol goal of 40; Triglyceride goal of 150.  Her blood pressure goal is < 130/80.    Hypertension Assessment/Plan:      The patient's hypertensive risk group is category C: Target organ damage and/or diabetes.  Her calculated 10 year  risk of coronary heart disease is 27 %.  Today's blood pressure is 170/95.  Her blood pressure goal is < 130/80.  Lipid Assessment/Plan:      Based on NCEP/ATP III, the patient's risk factor category is "history of diabetes".  The patient's lipid goals are as follows: Total cholesterol goal is 200; LDL cholesterol goal is 100; HDL cholesterol goal is 40; Triglyceride goal is 150.    Patient Instructions: 1)  Your blood pressure is still high. 2)  Blood pressure - Take Avapro 150mg  - Take both in the morning. 3)  Amlodipine 10mg  by mouth in the afternoon 4)  Schedule an appointment with Aquilla Solian AND nurse visit for a blood pressure check on the same day- take medications before this visit. 5)  Follow up in 2 months for diabetes and htn. 6)  Will get Hgbba1c and lipids 7)  Do not eat after midnight before this visit Prescriptions: LEXAPRO 10 MG TABS (ESCITALOPRAM OXALATE) One tablet by mouth daily  #30 x 5   Entered and Authorized by:   Lehman Prom FNP   Signed by:   Lehman Prom FNP on 01/02/2010   Method used:   Faxed to ...       [facility] (retail)       [address] [place], [place]  [address]       Ph: [phone]       Fax: [phone]   RxID:   [id number]   Last LDL:                                                 133 (04/15/2009 10:53:00 PM)      Diabetic Foot Exam Foot Inspection Is there a history of a foot ulcer?              No Is there a foot ulcer now?  No Is there swelling or an abnormal foot shape?          No Are the toenails long?                No Are the toenails thick?                No Are the toenails ingrown?              No Is there heavy callous build-up?              No Is there a claw toe deformity?                          No Is there elevated skin temperature?            No Is there limited ankle dorsiflexion?            No Is there foot or ankle muscle weakness?             No Do you have pain in calf while walking?           No

## 2010-08-19 NOTE — Letter (Signed)
Summary: *HSN Results Follow up  [facility]  [address] [place], [place] [address]   Phone: [phone]  Fax: [phone]      12/12/2009   Delmar Friesz [address] [place], [place]  [address]   Dear  Ms. Irving Burton Pranger,                            ____S.Drinkard,FNP   ____D. Gore,FNP       ____B. McPherson,MD   ____V. Rankins,MD    ____E. Mulberry,MD    _X___N. Daphine Deutscher, FNP  ____D. Reche Dixon, MD    ____K. Philipp Deputy, MD    ____Other     This letter is to inform you that your recent test(s):  ___X____Pap Smear    ___X____Lab Test     _______X-ray    ____X___ is within acceptable limits  _______ requires a medication change  _______ requires a follow-up lab visit  _______ requires a follow-up visit with your provider   Comments: Pap Smear and labs done during last office visit ok.       _________________________________________________________ If you have any questions, please contact our office [phone].                    Sincerely,    Lehman Prom FNP [facility]

## 2010-08-19 NOTE — Letter (Signed)
Summary: MAILED REQUESTED RECORDS TO [facility]  MAILED REQUESTED RECORDS TO [facility]   Imported By: Arta Bruce 03/07/2010 11:52:28  _____________________________________________________________________  External Attachment:    Type:   Image     Comment:   External Document

## 2010-08-19 NOTE — Letter (Signed)
Summary: Handout Printed  Printed Handout:  - Diets for Diabetes - Food Labeling and Dietetic Products 

## 2010-08-19 NOTE — Progress Notes (Signed)
Summary: Hit toe on couch  Phone Note Call from Patient   Caller: Patient Summary of Call: States that she hit her right toe on the leg of her couch yesterday; it almost immediately started to turn blue.  Says she put ice on it, denies pain or swelling, is able to move the toe.  Is concerned because she is a diabetic.  States her blood sugar remains "pretty much the same" without major fluctuations. Advised to put ice on for 20 mins. several times a day, OK to soak foot in warm water for comfort and to monitor for changes in color, any edema or pain; to notify office if any changes occur.   Initial call taken by: Dutch Quint RN,  February 17, 2010 11:35 AM  Follow-up for Phone Call        good advise. will await for pt to contact us with any changes Follow-up by: Lehman Prom FNP,  February 18, 2010 8:03 AM

## 2010-08-19 NOTE — Letter (Signed)
Summary: *HSN Results Follow up  [facility]  [address] [place], [place] [address]   Phone: [phone]  Fax: [phone]      11/22/2009   [name] [address] [place], [place]  [address]   Dear  Ms. Melanie French,                            ____S.Drinkard,FNP   ____D. Gore,FNP       ____B. McPherson,MD   ____V. Rankins,MD    ____E. Mulberry,MD    __X__N. Daphine Deutscher, FNP  ____D. Reche Dixon, MD    ____K. Philipp Deputy, MD    ____Other     This letter is to inform you that your recent test(s):  ___X____Pap Smear    _______Lab Test     _______X-ray    ____X___ is within acceptable limits  _______ requires a medication change  _______ requires a follow-up lab visit  _______ requires a follow-up visit with your provider   Comments: Pap Smear results normal.       _________________________________________________________ If you have any questions, please contact our office [phone].                    Sincerely,    Lehman Prom FNP [facility]

## 2010-08-19 NOTE — Progress Notes (Signed)
Summary: Medical question  Phone Note Call from Patient Call back at [facility] Phone [phone] Call back at [phone]   Summary of Call: The pt has a medical question for the provider because she had Jamueta and medformin medication for dm but she doesn't know if she needs to take both medication at the same time or separetly.  Please call her back if she is not there leave a message. [facility] FnP Initial call taken by: Manon Hilding,  Dec 05, 2009 8:36 AM  Follow-up for Phone Call        left message on answer machine for pt to return call Gaylyn Cheers RN  Dec 05, 2009 11:20 AM   Additional Follow-up for Phone Call Additional follow up Details #1::        Pt was CHANGED to janumet during last visit. Once she gets that medication then she needs to stop the metformin (glucophage) and take the janumet (which is [place] and metformin) Additional Follow-up by: Lehman Prom FNP,  Dec 05, 2009 12:14 PM    Additional Follow-up for Phone Call Additional follow up Details #2::    Phone has been disconnected.  Letter sent. Dutch Quint RN  Dec 05, 2009 3:33 PM

## 2010-08-19 NOTE — Letter (Signed)
Summary: *HSN Results Follow up  [facility]  [address] [place], [place] [address]   Phone: [phone]  Fax: [phone]      01/15/2010   Clotine Sundell [address] [place], [place]  [address]   Dear  Ms. Irving Burton Haefner,                            ____S.Drinkard,FNP   ____D. Gore,FNP       ____B. McPherson,MD   ____V. Rankins,MD    ____E. Mulberry,MD    __X__N. Daphine Deutscher, FNP  ____D. Reche Dixon, MD    ____K. Philipp Deputy, MD    ____Other     This letter is to inform you that your recent test(s):  Retasure Eye Screening    ____X___ is within acceptable limits  _______ requires a medication change  _______ requires a follow-up lab visit  _______ requires a follow-up visit with your provider   Comments: Retasure eye screening ok.       _________________________________________________________ If you have any questions, please contact our office [phone].                    Sincerely,    Lehman Prom FNP [facility]

## 2010-08-19 NOTE — Letter (Signed)
Summary: *HSN Results Follow up  [facility]  [address] [place], [place] [address]   Phone: [phone]  Fax: [phone]      06/03/2010   Tyyne Meter [address] [place], [place]  [address]   Dear  Ms. Irving Burton Adames,                            ____S.Drinkard,FNP   ____D. Gore,FNP       ____B. McPherson,MD   ____V. Rankins,MD    ____E. Mulberry,MD    ____N. Daphine Deutscher, FNP  ____D. Reche Dixon, MD    ____K. Philipp Deputy, MD    ____Other     This letter is to inform you that your recent test(s):  _______Pap Smear    _______Lab Test     _______X-ray    _______ is within acceptable limits  _______ requires a medication change  __X_____ requires a follow-up lab visit  _______ requires a follow-up visit with your provider   Comments:   We have tried to contact you at [phone].  Please contact the office at your earliest convenience. We need you to schedule a follow-up lab visit.       _________________________________________________________ If you have any questions, please contact our office                     Sincerely,  Levon Hedger [facility]

## 2010-08-19 NOTE — Letter (Signed)
Summary: *HSN Results Follow up  [facility]  [address] [place], [place] [address]   Phone: [phone]  Fax: [phone]      12/19/2009   Melanie French [address] [place], [place]  [address]   Dear  Ms. Melanie French,                            ____S.Drinkard,FNP   ____D. Gore,FNP       ____B. McPherson,MD   ____V. Rankins,MD    ____E. Mulberry,MD    _X___N. Daphine Deutscher, FNP  ____D. Reche Dixon, MD    ____K. Philipp Deputy, MD    ____Other     This letter is to inform you that your recent test(s):  Stool cards     ___X____ is within acceptable limits  _______ requires a medication change  _______ requires a follow-up lab visit  _______ requires a follow-up visit with your provider   Comments: Stool cards x 3 are normal.       _________________________________________________________ If you have any questions, please contact our office [phone].                    Sincerely,    Melanie Prom FNP [facility]

## 2010-08-19 NOTE — Assessment & Plan Note (Signed)
Summary: Complete Physical Exam   Vital Signs:  Patient profile:   55 year old female Menstrual status:  postmenopausal Weight:      173.5 pounds BMI:     28.10 BSA:     1.88 Temp:     98.0 degrees F oral Pulse rate:   90 / minute Pulse rhythm:   regular Resp:     20 per minute BP sitting:   146 / 100  (left arm) Cuff size:   regular  Vitals Entered By: Levon Hedger (Nov 19, 2009 9:01 AM) CC: CPP, Depression, Hypertension Management, Lipid Management Is Patient Diabetic? Yes Pain Assessment Patient in pain? no      CBG Result 258 CBG Device ID B  Does patient need assistance? Functional Status Self care Ambulation Normal   CC:  CPP, Depression, Hypertension Management, and Lipid Management.  History of Present Illness:  Pt into the office for a complete physical exam  PAP - last done one year ago.  Previous abnormal PAP during early adulthood no family history of cervial or ovarian cancer 1 child - post menopausal menses - post menopausal x 3 years  Mammogram - last done 1 year ago.  no family history of breast cancer  Optho - wears reading glasses. no recent eye exam.  last eye exam was about 3 years ago  Dental - pt does have some dental issues and she does need some attention.  No dental appt in several years.  Tdap - up to date  Depression History:      The patient is having a depressed mood most of the day and has a diminished interest in her usual daily activities.  Positive alarm features for depression include insomnia and fatigue (loss of energy).  However, she denies recurrent thoughts of death or suicide.        Psychosocial stress factors include major life changes.  The patient denies that she feels like life is not worth living, denies that she wishes that she were dead, and denies that she has thought about ending her life.  Her current symptoms often keep her from doing the things she needs to do.        Comments:  Pt has been to see Aquilla Solian and pt has finally determined that she would like to restart on medications.  She has taken paxil in the past.  .  Diabetes Management History:      The patient is a 54 years old female who comes in for evaluation of DM Type 2.  She has not been enrolled in the "Diabetic Education Program".  She states understanding of dietary principles and is following her diet appropriately.  Sensory loss is noted.  Self foot exams are not being performed.  She is checking home blood sugars.  She says that she is exercising.  Type of exercise includes: aerobics/walking.        Hypoglycemic symptoms are not occurring.  No hyperglycemic symptoms are reported.        Symptoms which suggest diabetic complications include vision problems.  No changes have been made to her treatment plan since last visit.    Hypertension History:      She denies headache, chest pain, and palpitations.  She notes no problems with any antihypertensive medication side effects.        Positive major cardiovascular risk factors include female age 11 years old or older, diabetes, hyperlipidemia, hypertension, and family history for ischemic heart disease (females  less than 74 years old).  Negative major cardiovascular risk factors include non-tobacco-user status.        Further assessment for target organ damage reveals no history of ASHD, cardiac end-organ damage (CHF/LVH), stroke/TIA, peripheral vascular disease, renal insufficiency, or hypertensive retinopathy.    Lipid Management History:      Positive NCEP/ATP III risk factors include female age 64 years old or older, diabetes, family history for ischemic heart disease (females less than 1 years old), and hypertension.  Negative NCEP/ATP III risk factors include non-tobacco-user status, no ASHD (atherosclerotic heart disease), no prior stroke/TIA, no peripheral vascular disease, and no history of aortic aneurysm.        The patient states that she knows about the "Therapeutic  Lifestyle Change" diet.  Her compliance with the TLC diet is fair.  She expresses no side effects from her lipid-lowering medication.  The patient denies any symptoms to suggest myopathy or liver disease.  Comments include: pt is not fasting today for labs.       Habits & Providers  Alcohol-Tobacco-Diet     Alcohol drinks/day: 0     Tobacco Status: never  Exercise-Depression-Behavior     Does Patient Exercise: yes     Exercise Counseling: not indicated; exercise is adequate     Type of exercise: aerobics/walking     Have you felt down or hopeless? yes     Have you felt little pleasure in things? yes     Depression Counseling: further diagnostic testing and/or other treatment is indicated     Drug Use: no     Seat Belt Use: 100     Sun Exposure: occasionally  Comments: PHQ-9 score = 19  Allergies (verified): No Known Drug Allergies  Review of Systems General:  Complains of fatigue; denies loss of appetite. Eyes:  Denies discharge. ENT:  Denies earache and sore throat. CV:  Denies chest pain or discomfort. Resp:  Denies cough. GI:  Denies abdominal pain, nausea, and vomiting. GU:  Denies dysuria. MS:  Denies joint pain. Derm:  vitiligo - skin burns during the summer months. Neuro:  Denies headaches. Psych:  Complains of depression. Endo:  Complains of excessive thirst; denies excessive urination.  Physical Exam  General:  alert.   Head:  normocephalic.   Eyes:  vision grossly intact, pupils equal, and pupils round.   Ears:  ear piercing(s) noted.  bil TM with clear fluid  Nose:  no nasal discharge.   Mouth:  poor dentition.  abnormal spacing between teeth Neck:  supple.   Chest Wall:  no mass.   Breasts:  skin/areolae normal, no masses, and no abnormal thickening.   Lungs:  normal breath sounds.   Heart:  normal rate and regular rhythm.   Abdomen:  soft, non-tender, and normal bowel sounds.   Rectal:  no external abnormalities.   Msk:  up to the exam  table Pulses:  R radial normal and L radial normal.   Extremities:  no edema Neurologic:  alert & oriented X3.   Skin:  hypopigmented areas - vitiligo  Pelvic Exam  Vulva:      normal appearance.   Vagina:      physiologic discharge, post-menopausal.   Cervix:      midposition.   Uterus:      smooth.   Adnexa:      nontender bilaterally.   Rectum:      normal, heme negative stool.    Diabetes Management Exam:    Foot Exam (  with socks and/or shoes not present):       Inspection:          Left foot: normal          Right foot: normal       Nails:          Left foot: thickened          Right foot: thickened    Impression & Recommendations:  Problem # 1:  ROUTINE GYNECOLOGICAL EXAMINATION (ICD-V72.31)  PHQ-9 score = 19 EKG done colonscopy indications given to pt stool cards x 3 given to pt stool card negative x 3 advised pt to schedule retasure eye exam she need dental appt  Orders: Hemoccult Cards MCR Screening (Z6109) Rapid HIV  (60454) T-TSH (09811-91478) KOH/ WET Mount [phone]) Pap Smear, Thin Prep ( Collection of) (Q0091) T- GC Chlamydia (13086)  Problem # 2:  OTHER SCREENING BREAST EXAMINATION (ICD-V76.19) self breast exam placcard given to pt mammogram scheduled Orders: Mammogram (Screening) (Mammo)  Problem # 3:  HYPERCHOLESTEROLEMIA (ICD-272.0) continue current meds  Her updated medication list for this problem includes:    Pravastatin Sodium 20 Mg Tabs (Pravastatin sodium) ..... One tablet by mouth nightly for cholesterol  Orders: T-Comprehensive Metabolic Panel (57846-96295)  Problem # 4:  HYPERTENSION, BENIGN ESSENTIAL (ICD-401.1) BP is better but improved will increase norvasc to 10mg   reviewed DASH diet  Her updated medication list for this problem includes:    Avapro 150 Mg Tabs (Irbesartan) .Marland Kitchen... 1 tablet by mouth two times a day for blood pressure    Norvasc 10 Mg Tabs (Amlodipine besylate) ..... One tablet by mouth daily for blood  pressure **note change in dose**  Orders: EKG w/ Interpretation (93000) T-Comprehensive Metabolic Panel (28413-24401) T-CBC w/Diff (02725-36644) T-Urine Microalbumin w/creat. ratio [phone]) UA Dipstick w/o Micro (manual) (64332)  Problem # 5:  DIABETES MELLITUS (ICD-250.00) uncontrolled. will change to janumet Her updated medication list for this problem includes:    Janumet 50-1000 Mg Tabs (Sitagliptin-metformin hcl) ..... One tablet by mouth two times a day for blood sugar    Bayer Low Strength 81 Mg Tbec (Aspirin) .Marland Kitchen... 1 tablet by mouth daily for circulation    Avapro 150 Mg Tabs (Irbesartan) .Marland Kitchen... 1 tablet by mouth two times a day for blood pressure    Glucotrol Xl 10 Mg Xr24h-tab (Glipizide) .Marland Kitchen... 1 tablet by mouth daily for blood pressure    Her updated medication list for this problem includes:    Glucophage 1000 Mg Tabs (Metformin hcl) .Marland Kitchen... 1 tablet by mouth two times a day before breakfast and dinner    Bayer Low Strength 81 Mg Tbec (Aspirin) .Marland Kitchen... 1 tablet by mouth daily for circulation    Avapro 150 Mg Tabs (Irbesartan) .Marland Kitchen... 1 tablet by mouth two times a day for blood pressure    Glucotrol Xl 10 Mg Xr24h-tab (Glipizide) .Marland Kitchen... 1 tablet by mouth daily for blood pressure  Orders: Capillary Blood Glucose/CBG (82948) Hemoglobin A1C (83036)  Problem # 6:  ANXIETY DEPRESSION (ICD-300.4) PHQ-9 score = 19 will start lexapro 10mg  by mouth daily (samples given)  Complete Medication List: 1)  Janumet 50-1000 Mg Tabs (Sitagliptin-metformin hcl) .... One tablet by mouth two times a day for blood sugar 2)  Bayer Low Strength 81 Mg Tbec (Aspirin) .Marland Kitchen.. 1 tablet by mouth daily for circulation 3)  Avapro 150 Mg Tabs (Irbesartan) .Marland Kitchen.. 1 tablet by mouth two times a day for blood pressure 4)  Apap 500 Mg Tabs (Acetaminophen) .... 2 tablets by  mouth daily as needed for pain 5)  Glucometer Elite Classic Kit (Blood glucose monitoring suppl) .... Dispense meter to check blood  sugar 6)  Freestyle Lancets Misc (Lancets) .... Use to check blood sugar twice daily 7)  Glucotrol Xl 10 Mg Xr24h-tab (Glipizide) .Marland Kitchen.. 1 tablet by mouth daily for blood pressure 8)  Pravastatin Sodium 20 Mg Tabs (Pravastatin sodium) .... One tablet by mouth nightly for cholesterol 9)  Norvasc 10 Mg Tabs (Amlodipine besylate) .... One tablet by mouth daily for blood pressure **note change in dose** 10)  Nexium 40 Mg Cpdr (Esomeprazole magnesium) .... One tablet by mouth daily 30 minutes before meals for stomach 11)  Ibuprofen 800 Mg Tabs (Ibuprofen) .... One tablet by mouth three times a day as needed for pain 12)  Nortriptyline Hcl 10 Mg Caps (Nortriptyline hcl) .... One capsule by mouth nightly as needed for leg cramps 13)  Lexapro 10 Mg Tabs (Escitalopram oxalate) .... One tablet by mouth daily  Other Orders: Hemoccult Cards (Take Home) (Hemoccult Cards)  Diabetes Management Assessment/Plan:      The following lipid goals have been established for the patient: Total cholesterol goal of 200; LDL cholesterol goal of 100; HDL cholesterol goal of 40; Triglyceride goal of 150.  Her blood pressure goal is < 130/80.    Hypertension Assessment/Plan:      The patient's hypertensive risk group is category C: Target organ damage and/or diabetes.  Her calculated 10 year risk of coronary heart disease is 27 %.  Today's blood pressure is 146/100.  Her blood pressure goal is < 130/80.  Lipid Assessment/Plan:      Based on NCEP/ATP III, the patient's risk factor category is "history of diabetes".  The patient's lipid goals are as follows: Total cholesterol goal is 200; LDL cholesterol goal is 100; HDL cholesterol goal is 40; Triglyceride goal is 150.    Patient Instructions: 1)  Blood pressure - better but still slightly elevated.  will increase norvasc to 10mg  by mouth daily 2)  Diabetes - uncontrolled.  Your hgba1c = 10.1.  Will change medications to janumet 50/1000 by mouth two times a day  3)  New  prescriptions sent electronically to the pharmacy 4)  Depression - start lexapro 10mg  by mouth daily (samples) 5)  Eye Exam - schedule an appointment for retasure eye exam at [facility] 6)  Follow up in 4 weeks for medication review - lexapro 7)  will also ? referral to dermatology Prescriptions: NEXIUM 40 MG CPDR (ESOMEPRAZOLE MAGNESIUM) One tablet by mouth daily 30 minutes before meals for stomach  #30 x 5   Entered and Authorized by:   Lehman Prom FNP   Signed by:   Lehman Prom FNP on 11/19/2009   Method used:   Faxed to ...       [facility] (retail)       [address] [place], [place]  [address]       Ph: [phone]       Fax: [phone]   RxID:   [id number] GLUCOTROL XL 10 MG XR24H-TAB (GLIPIZIDE) 1 tablet by mouth daily for blood pressure  #30 x 5   Entered and Authorized by:   Lehman Prom FNP   Signed by:   Lehman Prom FNP on 11/19/2009   Method used:   Faxed to ...       [facility] - [facility] (retail)  [address] [place], [place]  [address]       Ph: [phone]       Fax: [phone]   RxID:   [id number] AVAPRO 150 MG TABS (IRBESARTAN) 1 tablet by mouth two times a day for blood pressure  #60 x 5   Entered and Authorized by:   Lehman Prom FNP   Signed by:   Lehman Prom FNP on 11/19/2009   Method used:   Faxed to ...       [facility] - [facility] (retail)       [address] [place], [place]  [address]       Ph: [phone]       Fax: [phone]   RxID:   [id number] LEXAPRO 10 MG TABS (ESCITALOPRAM OXALATE) One tablet by mouth daily  #28 x 0   Entered and Authorized by:   Lehman Prom FNP   Signed by:   Lehman Prom FNP on 11/19/2009   Method used:   Faxed to ...       [facility] - [facility] (retail)       [address] [place], [place]  [address]        Ph: [phone]       Fax: [phone]   RxID:   [id number] JANUMET 50-1000 MG TABS (SITAGLIPTIN-METFORMIN HCL) One tablet by mouth two times a day for blood sugar  #60 x 5   Entered and Authorized by:   Lehman Prom FNP   Signed by:   Lehman Prom FNP on 11/19/2009   Method used:   Faxed to ...       [facility] - [facility] (retail)       [address] [place], [place]  [address]       Ph: [phone]       Fax: [phone]   RxID:   [id number] NORVASC 10 MG TABS (AMLODIPINE BESYLATE) One tablet by mouth daily for blood pressure **note change in dose**  #30 x 5   Entered and Authorized by:   Lehman Prom FNP   Signed by:   Lehman Prom FNP on 11/19/2009   Method used:   Faxed to ...       [facility] - Pharmac (retail)       [address] [place], [place]  [address]       Ph: [phone]       Fax: [phone]   RxID:   [id number]   Laboratory Results   Urine Tests  Date/Time Received: Nov 19, 2009 10:14 AM  Date/Time Reported: Nov 19, 2009 10:14 AM   Routine Urinalysis   Color: lt. yellow Glucose: 250   (Normal Range: Negative) Bilirubin: negative   (Normal Range: Negative) Ketone: negative   (Normal Range: Negative) Spec. Gravity: >=1.030   (Normal Range: 1.003-1.035) Blood: negative   (Normal Range: Negative) pH: 5.0   (Normal Range: 5.0-8.0) Protein: negative   (Normal Range: Negative) Urobilinogen: 1.0   (Normal Range: 0-1) Nitrite: negative   (Normal Range: Negative) Leukocyte Esterace: trace   (Normal Range: Negative)     Blood Tests   Date/Time Received: Nov 19, 2009 10:41 AM   HGBA1C: 10.1%   (Normal Range: Non-Diabetic - 3-6%   Control Diabetic - 6-8%) CBG  Random:: 258  Date/Time Received: Nov 19, 2009 10:42 AM   Wet Mount/KOH Source: vaginal WBC/hpf: 1-5 Bacteria/hpf: rare Clue cells/hpf: none Yeast/hpf: none Trichomonas/hpf:  none  Other Tests  Rapid HIV: negative  Stool - Occult Blood Hemmoccult #1: negative Date: 11/19/2009      Laboratory Results   Urine Tests    Routine Urinalysis   Color: lt. yellow Glucose: 250   (Normal Range: Negative) Bilirubin: negative   (Normal Range: Negative) Ketone: negative   (Normal Range: Negative) Spec. Gravity: >=1.030   (Normal Range: 1.003-1.035) Blood: negative   (Normal Range: Negative) pH: 5.0   (Normal Range: 5.0-8.0) Protein: negative   (Normal Range: Negative) Urobilinogen: 1.0   (Normal Range: 0-1) Nitrite: negative   (Normal Range: Negative) Leukocyte Esterace: trace   (Normal Range: Negative)     Blood Tests     HGBA1C: 10.1%   (Normal Range: Non-Diabetic - 3-6%   Control Diabetic - 6-8%) CBG Random:: 258mg /dL    DIRECTV KOH: Negative  Other Tests  Rapid HIV: negative  Stool - Occult Blood Hemmoccult #1: negative   Appended Document: Complete Physical Exam/ hemoccult results     Allergies: No Known Drug Allergies   Complete Medication List: 1)  Janumet 50-1000 Mg Tabs (Sitagliptin-metformin hcl) .... One tablet by mouth two times a day for blood sugar 2)  Bayer Low Strength 81 Mg Tbec (Aspirin) .Marland Kitchen.. 1 tablet by mouth daily for circulation 3)  Avapro 150 Mg Tabs (Irbesartan) .Marland Kitchen.. 1 tablet by mouth two times a day for blood pressure 4)  Apap 500 Mg Tabs (Acetaminophen) .... 2 tablets by mouth daily as needed for pain 5)  Glucometer Elite Classic Kit (Blood glucose monitoring suppl) .... Dispense meter to check blood sugar 6)  Freestyle Lancets Misc (Lancets) .... Use to check blood sugar twice daily 7)  Glucotrol Xl 10 Mg Xr24h-tab (Glipizide) .Marland Kitchen.. 1 tablet by mouth daily for blood pressure 8)  Pravastatin Sodium 20 Mg Tabs (Pravastatin sodium) .... One tablet by mouth nightly for cholesterol 9)  Norvasc 10 Mg Tabs (Amlodipine besylate) .... One tablet by mouth daily for blood pressure **note change in  dose** 10)  Nexium 40 Mg Cpdr (Esomeprazole magnesium) .... One tablet by mouth daily 30 minutes before meals for stomach 11)  Ibuprofen 800 Mg Tabs (Ibuprofen) .... One tablet by mouth three times a day as needed for pain 12)  Nortriptyline Hcl 10 Mg Caps (Nortriptyline hcl) .... One capsule by mouth nightly as needed for leg cramps 13)  Lexapro 10 Mg Tabs (Escitalopram oxalate) .... One tablet by mouth daily   Laboratory Results  Date/Time Received: December 19, 2009 5:00 PM   Stool - Occult Blood Hemmoccult #1: negative Date: 12/19/2009 Hemoccult #2: negative Date: 12/19/2009 Hemoccult #3: negative Date: 12/19/2009

## 2010-08-19 NOTE — Letter (Signed)
Summary: PT INFORMATION SHEET  PT INFORMATION SHEET   Imported By: Arta Bruce 01/16/2010 11:17:13  _____________________________________________________________________  External Attachment:    Type:   Image     Comment:   External Document

## 2010-08-19 NOTE — Letter (Signed)
Summary: Generic Letter  [facility]  [address] [place], [place] [address]   Phone: [phone]  Fax: [phone]    12/05/2009  Melanie French [address] [place], [place]  [address]  Dear Melanie French,  We attempted to contact you by telephone, but were unsuccessful.  Your metformin was changed to Janumet during your last visit.  Once you get that medicine (the Janumet), you need to stop taking the metformin (Glucophage) and take only the Janumet (which is Januvia and metformin).  If you have any further questions, please do not hesitate to call us.   Sincerely,   Dutch Quint RN  Appended Document: Orders Update Spoke with pt. -- reviewed all medications, dosages and frequencies.  Verbalized understanding.  Dutch Quint RN  Dec 05, 2009 4:15 PM    Clinical Lists Changes

## 2010-08-19 NOTE — Miscellaneous (Signed)
Summary: Pravastatin refill  Phone Note Outgoing Call   Summary of Call: will refill pravastatin for 30 days last cholesterol done 06/2009 advise pt she need a lab visit for lipids/cmp Initial call taken by: Lehman Prom FNP,  June 02, 2010 7:25 PM  Follow-up for Phone Call        called [phone] this line has been disconnected.  Will mail letter. Follow-up by: Levon Hedger,  June 03, 2010 10:37 AM    Clinical Lists Changes  Medications: Rx of PRAVASTATIN SODIUM 20 MG TABS (PRAVASTATIN SODIUM) One tablet by mouth nightly for cholesterol;  #30 x 0;  Signed;  Entered by: Lehman Prom FNP;  Authorized by: Lehman Prom FNP;  Method used: Faxed to [facility], [address], [place], [place]  [address], Ph: [phone], Fax: [phone]    Prescriptions: PRAVASTATIN SODIUM 20 MG TABS (PRAVASTATIN SODIUM) One tablet by mouth nightly for cholesterol  #30 x 0   Entered and Authorized by:   Lehman Prom FNP   Signed by:   Lehman Prom FNP on 06/02/2010   Method used:   Faxed to ...       [facility] - [facility] (retail)       [address] [place], [place]  [address]       Ph: [phone]       Fax: [phone]   RxID:   [id number]

## 2010-08-19 NOTE — Letter (Signed)
Summary: RETASURE  RETASURE   Imported By: Arta Bruce 01/15/2010 12:56:10  _____________________________________________________________________  External Attachment:    Type:   Image     Comment:   External Document

## 2010-08-28 ENCOUNTER — Telehealth (INDEPENDENT_AMBULATORY_CARE_PROVIDER_SITE_OTHER): Payer: Self-pay | Admitting: Nurse Practitioner

## 2010-09-02 ENCOUNTER — Encounter (INDEPENDENT_AMBULATORY_CARE_PROVIDER_SITE_OTHER): Payer: Self-pay | Admitting: *Deleted

## 2010-09-04 NOTE — Letter (Signed)
Summary: DERMATOLOGY/SOCIAL WORK /NO SHOWED  DERMATOLOGY/SOCIAL WORK /NO SHOWED   Imported By: Arta Bruce 08/26/2010 10:52:27  _____________________________________________________________________  External Attachment:    Type:   Image     Comment:   External Document

## 2010-09-10 NOTE — Letter (Signed)
Summary: *HSN Results Follow up  [facility]  [address] [place], [place] [address]   Phone: [phone]  Fax: [phone]      09/02/2010   Melanie French [address] [place], [place]  [address]   Dear  [name],                            ____S.Drinkard,FNP   ____D. Gore,FNP       ____B. McPherson,MD   ____V. Rankins,MD    ____E. Mulberry,MD    ____N. Daphine Deutscher, FNP  ____D. Reche Dixon, MD    ____K. Philipp Deputy, MD    ____Other     This letter is to inform you that your recent test(s):  _______Pap Smear    ___X____Lab Test     _______X-ray    _______ is within acceptable limits  _______ requires a medication change  ___X____ requires a follow-up lab visit  _______ requires a follow-up visit with your provider   Comments:       _________________________________________________________ If you have any questions, please contact our office                     Sincerely,  [name] [facility]

## 2010-09-10 NOTE — Progress Notes (Signed)
Summary: Need lab visit  Phone Note Outgoing Call   Summary of Call: advise pt that she requested cholesterol meds but it has been over 1 year since fasting labs were done schedule a lab visit for lipids, lft Initial call taken by: Lehman Prom FNP,  August 28, 2010 1:41 PM  Follow-up for Phone Call        number is disconnected. .[name]  August 29, 2010 2:16 PM   number is disconnected... will mail letter.Marland KitchenMarland KitchenArmenia Shannon  September 02, 2010 12:27 PM

## 2010-10-08 ENCOUNTER — Encounter (INDEPENDENT_AMBULATORY_CARE_PROVIDER_SITE_OTHER): Payer: Self-pay | Admitting: Nurse Practitioner

## 2010-10-08 ENCOUNTER — Encounter: Payer: Self-pay | Admitting: Nurse Practitioner

## 2010-10-08 LAB — CONVERTED CEMR LAB
Blood Glucose, Fingerstick: 167
Hgb A1c MFr Bld: 9.7 %
Nitrite: NEGATIVE
Specific Gravity, Urine: >=1.03
Urobilinogen, UA: NEGATIVE

## 2010-10-16 NOTE — Assessment & Plan Note (Signed)
Summary: Diabetes/HTN   Vital Signs:  Patient profile:   55 year old female Menstrual status:  postmenopausal Weight:      170.6 pounds BMI:     27.64 Pulse rate:   96 / minute Pulse rhythm:   regular Resp:     20 per minute BP sitting:   155 / 100  (left arm)  Vitals Entered By: Levon Hedger (October 08, 2010 3:07 PM)  Nutrition Counseling: Patient's BMI is greater than 25 and therefore counseled on weight management options. CC: follow-up visit DM..medication, labwork, Hypertension Management, Depression, Lipid Management Is Patient Diabetic? Yes Pain Assessment Patient in pain? no      CBG Result 167  Does patient need assistance? Functional Status Self care Ambulation Normal   CC:  follow-up visit DM..medication, labwork, Hypertension Management, Depression, and Lipid Management.  History of Present Illness:  Pt into the office for follow up for diabetes. Pt has been out of town for several month and eligibility expired so she had to wait for f/u Pt presents today with medications and she admits she has been taking modestly due to finances  Depression History:      Positive alarm features for depression include fatigue (loss of energy).  However, she denies recurrent thoughts of death or suicide.        Psychosocial stress factors include major life changes.  The patient denies that she feels like life is not worth living, denies that she wishes that she were dead, and denies that she has thought about ending her life.        Comments:  Pt is still going to family services - she is going to be referred to the psychiatrist so her meds can be changed.  Depression Treatment History:  Prior Medication Used:   Start Date: Assessment of Effect:   Comments:  lexapro     11/19/2009   much improvement     pt is doing well  Diabetes Management History:      The patient is a 55 years old female who comes in for evaluation of DM Type 2.  She has not been enrolled in the  "Diabetic Education Program".  She states understanding of dietary principles and is following her diet appropriately.  No sensory loss is reported.  Self foot exams are being performed.  She is checking home blood sugars.  She says that she is exercising.  Type of exercise includes: aerobics/walking.        Hypoglycemic symptoms are not occurring.  No hyperglycemic symptoms are reported.        No changes have been made to her treatment plan since last visit.    Hypertension History:      She denies headache, chest pain, and palpitations.  She notes no problems with any antihypertensive medication side effects.        Positive major cardiovascular risk factors include female age 48 years old or older, diabetes, hyperlipidemia, hypertension, and family history for ischemic heart disease (females less than 20 years old).  Negative major cardiovascular risk factors include non-tobacco-user status.        Further assessment for target organ damage reveals no history of ASHD, cardiac end-organ damage (CHF/LVH), stroke/TIA, peripheral vascular disease, renal insufficiency, or hypertensive retinopathy.    Lipid Management History:      Positive NCEP/ATP III risk factors include female age 7 years old or older, diabetes, family history for ischemic heart disease (females less than 80 years old), and hypertension.  Negative NCEP/ATP III risk factors include non-tobacco-user status, no ASHD (atherosclerotic heart disease), no prior stroke/TIA, no peripheral vascular disease, and no history of aortic aneurysm.        The patient states that she knows about the "Therapeutic Lifestyle Change" diet.  Her compliance with the TLC diet is fair.    Diabetic Foot Exam Foot Inspection Is there a history of a foot ulcer?              No Is there a foot ulcer now?              No Is there swelling or an abnormal foot shape?          No Are the toenails long?                Yes Are the toenails thick?                 Yes Are the toenails ingrown?              No Is there heavy callous build-up?              No Is there pain in the calf muscle (Intermittent claudication) when walking?    NoIs there a claw toe deformity?              No Is there elevated skin temperature?            No Is there limited ankle dorsiflexion?            No Is there foot or ankle muscle weakness?            No  Diabetic Foot Care Education Patient educated on appropriate care of diabetic feet.  Pulse Check          Right Foot          Left Foot Dorsalis Pedis:        normal            normal Comments: callous under 3rd metatarsal under both feet   10-g (5.07) Semmes-Weinstein Monofilament Test Performed by: Levon Hedger          Right Foot          Left Foot Visual Inspection                   Allergies (verified): No Known Drug Allergies  Review of Systems General:  Denies fever. CV:  Denies chest pain or discomfort. Resp:  Denies cough. GI:  Denies abdominal pain, nausea, and vomiting. Psych:  Complains of anxiety and depression.  Physical Exam  General:  alert.   Head:  normocephalic.   Mouth:  poor dentition.   Lungs:  normal breath sounds.   Heart:  normal rate and regular rhythm.   Abdomen:  normal bowel sounds.   Msk:  up to the exam table Neurologic:  alert & oriented X3.   Skin:  vertiligo Psych:  Oriented X3.    Diabetes Management Exam:    Foot Exam (with socks and/or shoes not present):       Sensory-Monofilament:          Left foot: normal          Right foot: normal       Nails:          Left foot: thickened          Right foot: thickened   Impression & Recommendations:  Problem #  1:  DIABETES MELLITUS (ICD-250.00) Hgba1c = 9.7 Pt has not been taking meds as ordered.  admits that she has been sparing meds Her updated medication list for this problem includes:    Janumet 50-1000 Mg Tabs (Sitagliptin-metformin hcl) ..... One tablet by mouth two times a day for blood sugar     Bayer Low Strength 81 Mg Tbec (Aspirin) .Marland Kitchen... 1 tablet by mouth daily for circulation    Avapro 150 Mg Tabs (Irbesartan) .Marland Kitchen... 1 tablet by mouth two times a day for blood pressure    Glucotrol Xl 10 Mg Xr24h-tab (Glipizide) .Marland Kitchen... 1 tablet by mouth daily for blood sugar  Orders: Capillary Blood Glucose/CBG (55732) UA Dipstick w/o Micro (manual) (20254) Hemoglobin A1C (83036)  Problem # 2:  HYPERTENSION, BENIGN ESSENTIAL (ICD-401.1) BP elevated today - pt admits that she has not been taking meds daily due to finances Her updated medication list for this problem includes:    Avapro 150 Mg Tabs (Irbesartan) .Marland Kitchen... 1 tablet by mouth two times a day for blood pressure    Norvasc 10 Mg Tabs (Amlodipine besylate) ..... One tablet by mouth daily for blood pressure **note change in dose**  Problem # 3:  HYPERCHOLESTEROLEMIA (ICD-272.0) pt is not fasting today will have to come back for fasting labs Her updated medication list for this problem includes:    Pravastatin Sodium 20 Mg Tabs (Pravastatin sodium) ..... One tablet by mouth nightly for cholesterol  Problem # 4:  ANXIETY DEPRESSION (ICD-300.4) pt to continue to go to mental health  Complete Medication List: 1)  Janumet 50-1000 Mg Tabs (Sitagliptin-metformin hcl) .... One tablet by mouth two times a day for blood sugar 2)  Bayer Low Strength 81 Mg Tbec (Aspirin) .Marland Kitchen.. 1 tablet by mouth daily for circulation 3)  Avapro 150 Mg Tabs (Irbesartan) .Marland Kitchen.. 1 tablet by mouth two times a day for blood pressure 4)  Apap 500 Mg Tabs (Acetaminophen) .... 2 tablets by mouth daily as needed for pain 5)  Glucometer Elite Classic Kit (Blood glucose monitoring suppl) .... Dispense meter to check blood sugar 6)  Freestyle Lancets Misc (Lancets) .... Use to check blood sugar twice daily 7)  Glucotrol Xl 10 Mg Xr24h-tab (Glipizide) .Marland Kitchen.. 1 tablet by mouth daily for blood sugar 8)  Pravastatin Sodium 20 Mg Tabs (Pravastatin sodium) .... One tablet by mouth nightly  for cholesterol 9)  Norvasc 10 Mg Tabs (Amlodipine besylate) .... One tablet by mouth daily for blood pressure **note change in dose** 10)  Nexium 40 Mg Cpdr (Esomeprazole magnesium) .... One tablet by mouth daily 30 minutes before meals for stomach 11)  Ibuprofen 800 Mg Tabs (Ibuprofen) .... One tablet by mouth three times a day as needed for pain 12)  Nortriptyline Hcl 10 Mg Caps (Nortriptyline hcl) .... One capsule by mouth nightly as needed for leg cramps 13)  Lexapro 10 Mg Tabs (Escitalopram oxalate) .... One tablet by mouth daily  Diabetes Management Assessment/Plan:      The following lipid goals have been established for the patient: Total cholesterol goal of 200; LDL cholesterol goal of 100; HDL cholesterol goal of 40; Triglyceride goal of 150.  Her blood pressure goal is < 130/80.    Hypertension Assessment/Plan:      The patient's hypertensive risk group is category C: Target organ damage and/or diabetes.  Her calculated 10 year risk of coronary heart disease is 27 %.  Today's blood pressure is 155/100.  Her blood pressure goal is < 130/80.  Lipid Assessment/Plan:  Based on NCEP/ATP III, the patient's risk factor category is "history of diabetes".  The patient's lipid goals are as follows: Total cholesterol goal is 200; LDL cholesterol goal is 100; HDL cholesterol goal is 40; Triglyceride goal is 150.    Patient Instructions: 1)  Follow up for in 4 weeks for a lab visit  and blood pressure check- no food after midnight before this visit.  You will need lipids, cbc, cmp, lipids, tsh. Take your medications with WATER before this visit 2)  Refills on all your medications have been sent to [facility]. Call the pharmacy to request the refills 3)  Keep appointment with family services. Let this provider know what new medication is started 4)  Blood pressure - high today.  Be sure to take medications DAILY as ordered 5)  Diabetes - Hgba1c = 9.7 today. Remember the goal is less  than 7.  Elevation may be due to you not taking medications daily.  Please start bac k on your medications. 6)  Follow up with n.martin,fnp in 2 months for diabetes   Orders Added: 1)  Capillary Blood Glucose/CBG [82948] 2)  Est. Patient Level IV [16109] 3)  UA Dipstick w/o Micro (manual) [81002] 4)  Hemoglobin A1C [83036]    Laboratory Results   Urine Tests  Date/Time Received: October 08, 2010 3:13 PM   Routine Urinalysis   Color: [place] Appearance: Hazy Glucose: negative   (Normal Range: Negative) Bilirubin: small   (Normal Range: Negative) Ketone: trace (5)   (Normal Range: Negative) Spec. Gravity: >=1.030   (Normal Range: 1.003-1.035) Blood: negative   (Normal Range: Negative) pH: 5.0   (Normal Range: 5.0-8.0) Protein: 100   (Normal Range: Negative) Urobilinogen: negative   (Normal Range: 0-1) Nitrite: negative   (Normal Range: Negative) Leukocyte Esterace: small   (Normal Range: Negative)     Blood Tests   Date/Time Received: October 08, 2010 3:13 PM   HGBA1C: 9.7%   (Normal Range: Non-Diabetic - 3-6%   Control Diabetic - 6-8%) CBG Random:: 167       Last LDL:                                                 133 (04/15/2009 10:53:00 PM)        Diabetic Foot Exam Diabetic Foot Care Education :Patient educated on appropriate care of diabetic feet.  Pulse Check          Right Foot          Left Foot Dorsalis Pedis:        normal            normal    10-g (5.07) Semmes-Weinstein Monofilament Test Performed by: Levon Hedger          Right Foot          Left Foot Visual Inspection               Test Control      normal         normal Site 1         normal         normal Site 2         normal         normal Site 3         normal  normal Site 4         normal         normal Site 5         normal         normal Site 6         normal         normal Site 7         normal         normal Site 8         normal         normal Site 9         normal          normal Site 10         normal         normal  Impression      normal         normal

## 2011-04-24 LAB — POCT CARDIAC MARKERS
CKMB, poc: <1 ng/mL — ABNORMAL LOW (ref 1.0–8.0)
Myoglobin, poc: 89.3 ng/mL (ref 12–200)
Troponin i, poc: <0.05 ng/mL (ref 0.00–0.09)

## 2011-04-24 LAB — DIFFERENTIAL
Basophils Relative: 1 % (ref 0–1)
Eosinophils Absolute: 0.2 10*3/uL (ref 0.0–0.7)
Eosinophils Relative: 5 % (ref 0–5)
Lymphs Abs: 1.1 10*3/uL (ref 0.7–4.0)
Neutrophils Relative %: 64 % (ref 43–77)

## 2011-04-24 LAB — CBC
HCT: 43.2 % (ref 36.0–46.0)
MCHC: 32.5 g/dL (ref 30.0–36.0)
MCV: 86.2 fL (ref 78.0–100.0)
Platelets: 211 10*3/uL (ref 150–400)
WBC: 4.7 10*3/uL (ref 4.0–10.5)

## 2011-04-24 LAB — POCT I-STAT, CHEM 8
HCT: 45 % (ref 36.0–46.0)
Hemoglobin: 15.3 g/dL — ABNORMAL HIGH (ref 12.0–15.0)
Sodium: 140 mEq/L (ref 135–145)
TCO2: 28 mmol/L (ref 0–100)

## 2011-04-24 LAB — LITHIUM LEVEL: Lithium Lvl: <0.25 mEq/L — ABNORMAL LOW (ref 0.80–1.40)

## 2012-10-24 ENCOUNTER — Encounter: Payer: Self-pay | Admitting: Gastroenterology

## 2012-10-24 ENCOUNTER — Other Ambulatory Visit: Payer: Self-pay | Admitting: Family Medicine

## 2012-10-24 DIAGNOSIS — Z1231 Encounter for screening mammogram for malignant neoplasm of breast: Secondary | ICD-10-CM

## 2012-11-23 ENCOUNTER — Ambulatory Visit: Payer: Self-pay

## 2012-12-02 ENCOUNTER — Encounter: Payer: Self-pay | Admitting: Gastroenterology

## 2012-12-13 ENCOUNTER — Ambulatory Visit: Payer: Self-pay

## 2012-12-23 ENCOUNTER — Other Ambulatory Visit: Payer: Self-pay | Admitting: Internal Medicine

## 2012-12-23 ENCOUNTER — Ambulatory Visit
Admission: RE | Admit: 2012-12-23 | Discharge: 2012-12-23 | Disposition: A | Discharge status: Home / Self Care | Payer: Medicaid Other | Source: Ambulatory Visit | Attending: Family Medicine | Admitting: Family Medicine

## 2012-12-23 DIAGNOSIS — Z1231 Encounter for screening mammogram for malignant neoplasm of breast: Secondary | ICD-10-CM

## 2012-12-27 ENCOUNTER — Encounter: Payer: Self-pay | Admitting: Gastroenterology

## 2013-04-04 ENCOUNTER — Ambulatory Visit: Payer: Self-pay | Admitting: Obstetrics

## 2013-04-05 ENCOUNTER — Other Ambulatory Visit: Payer: Self-pay | Admitting: Gastroenterology

## 2013-04-05 DIAGNOSIS — Z1211 Encounter for screening for malignant neoplasm of colon: Secondary | ICD-10-CM

## 2013-04-07 ENCOUNTER — Encounter: Payer: Self-pay | Admitting: Obstetrics

## 2013-05-02 ENCOUNTER — Other Ambulatory Visit: Payer: Self-pay | Admitting: Gastroenterology

## 2013-05-02 ENCOUNTER — Ambulatory Visit
Admission: RE | Admit: 2013-05-02 | Discharge: 2013-05-02 | Disposition: A | Discharge status: Home / Self Care | Payer: Medicaid Other | Source: Ambulatory Visit | Attending: Gastroenterology | Admitting: Gastroenterology

## 2013-05-02 DIAGNOSIS — Z1211 Encounter for screening for malignant neoplasm of colon: Secondary | ICD-10-CM

## 2013-05-04 ENCOUNTER — Other Ambulatory Visit: Payer: Self-pay | Admitting: Gastroenterology

## 2013-05-04 DIAGNOSIS — Z139 Encounter for screening, unspecified: Secondary | ICD-10-CM

## 2013-07-05 ENCOUNTER — Other Ambulatory Visit: Payer: Medicaid Other

## 2013-10-03 ENCOUNTER — Ambulatory Visit: Payer: Medicaid Other | Admitting: Advanced Practice Midwife

## 2013-10-24 ENCOUNTER — Ambulatory Visit: Payer: Medicaid Other | Admitting: Advanced Practice Midwife

## 2014-12-31 ENCOUNTER — Other Ambulatory Visit: Payer: Self-pay

## 2014-12-31 DIAGNOSIS — Z1231 Encounter for screening mammogram for malignant neoplasm of breast: Secondary | ICD-10-CM

## 2015-01-25 ENCOUNTER — Ambulatory Visit: Payer: Medicaid Other

## 2016-09-09 ENCOUNTER — Ambulatory Visit (INDEPENDENT_AMBULATORY_CARE_PROVIDER_SITE_OTHER): Payer: Medicaid Other

## 2016-09-09 ENCOUNTER — Encounter (HOSPITAL_COMMUNITY): Payer: Self-pay | Admitting: *Deleted

## 2016-09-09 ENCOUNTER — Ambulatory Visit (HOSPITAL_COMMUNITY)
Admission: EM | Admit: 2016-09-09 | Discharge: 2016-09-09 | Disposition: A | Discharge status: Home / Self Care | Payer: Medicaid Other | Attending: Family Medicine | Admitting: Family Medicine

## 2016-09-09 DIAGNOSIS — S63501A Unspecified sprain of right wrist, initial encounter: Secondary | ICD-10-CM

## 2016-09-09 HISTORY — DX: Type 2 diabetes mellitus without complications: E11.9

## 2016-09-09 HISTORY — DX: Essential (primary) hypertension: I10

## 2016-09-09 MED ORDER — NAPROXEN 500 MG PO TABS: 500.0000 mg | ORAL_TABLET | Freq: Two times a day (BID) | ORAL | 0 refills | Status: DC

## 2016-09-09 NOTE — ED Provider Notes (Signed)
CSN: [id number]     Arrival date & time 09/09/16  1754 History   First MD Initiated Contact with Patient 09/09/16 1859     Chief Complaint  Patient presents with  . Fall  . Hand Pain  . Knee Pain   (Consider location/radiation/quality/duration/timing/severity/associated sxs/prior Treatment) Patient c/o fall and injury to her right wrist.  She c/o right hand and wrist discomfort.   The history is provided by the patient.  Fall  This is a new problem. The current episode started yesterday. The problem occurs constantly. The problem has not changed since onset.Nothing aggravates the symptoms.  Hand Pain   Knee Pain    Past Medical History:  Diagnosis Date  . Diabetes mellitus without complication (HCC)   . Hypertension    History reviewed. No pertinent surgical history. History reviewed. No pertinent family history. Social History  Substance Use Topics  . Smoking status: Never Smoker  . Smokeless tobacco: Never Used  . Alcohol use Not on file   OB History    No data available     Review of Systems  Constitutional: Negative.   HENT: Negative.   Eyes: Negative.   Respiratory: Negative.   Cardiovascular: Negative.   Gastrointestinal: Negative.   Endocrine: Negative.   Genitourinary: Negative.   Musculoskeletal: Positive for arthralgias and myalgias.  Skin: Negative.   Allergic/Immunologic: Negative.   Neurological: Negative.   Hematological: Negative.   Psychiatric/Behavioral: Negative.     Allergies  Patient has no known allergies.  Home Medications   Prior to Admission medications   Medication Sig Start Date End Date Taking? Authorizing Provider  amLODipine-atorvastatin (CADUET) 5-10 MG tablet Take 1 tablet by mouth daily.   Yes Historical Provider, MD  gabapentin (NEURONTIN) 400 MG capsule Take 400 mg by mouth 3 (three) times daily.   Yes Historical Provider, MD  lisinopril (PRINIVIL,ZESTRIL) 5 MG tablet Take 10 mg by mouth daily.   Yes Historical  Provider, MD  metformin (FORTAMET) 500 MG (OSM) 24 hr tablet Take 500 mg by mouth daily with breakfast.   Yes Historical Provider, MD  naproxen (NAPROSYN) 500 MG tablet Take 1 tablet (500 mg total) by mouth 2 (two) times daily with a meal. 09/09/16   Deatra Canter, FNP   Meds Ordered and Administered this Visit  Medications - No data to display  BP 137/83 (BP Location: Left Arm)   Pulse 74   Temp 98 F (36.7 C) (Oral)   Resp 17   SpO2 99%  No data found.   Physical Exam  Constitutional: She appears well-developed and well-nourished.  HENT:  Head: Normocephalic and atraumatic.  Eyes: Conjunctivae and EOM are normal. Pupils are equal, round, and reactive to light.  Neck: Normal range of motion. Neck supple.  Cardiovascular: Normal rate, regular rhythm and normal heart sounds.   Pulmonary/Chest: Effort normal and breath sounds normal.  Musculoskeletal: She exhibits tenderness.  Right wrist with TTP and swelling no deformity  Vitals reviewed.   Urgent Care Course     Procedures (including critical care time)  Labs Review Labs Reviewed - No data to display  Imaging Review Dg Wrist Complete Right  Result Date: 09/09/2016 CLINICAL DATA:  Fall onto outstretched hand today. Right wrist pain and bruising. Initial encounter. EXAM: RIGHT WRIST - COMPLETE 3+ VIEW COMPARISON:  None. FINDINGS: There is no evidence of fracture or dislocation. There is no evidence of arthropathy or other focal bone abnormality. Soft tissues are unremarkable. IMPRESSION: Negative. Electronically Signed   By:  Myles RosenthalJohn  Stahl M.D.   On: 09/09/2016 19:00     Visual Acuity Review  Right Eye Distance:   Left Eye Distance:   Bilateral Distance:    Right Eye Near:   Left Eye Near:    Bilateral Near:         MDM   1. Sprain of right wrist, initial encounter    Right wrist splint Naprosyn 500mg  one po bid x 10 days #20      Deatra CanterWilliam J Lum Stillinger, FNP 09/09/16 2014

## 2016-09-09 NOTE — ED Triage Notes (Signed)
Patient states she fell this am landing on right side, states pain to right hand and left knee. Swelling and bruising noted to left hand.

## 2017-04-30 ENCOUNTER — Ambulatory Visit (INDEPENDENT_AMBULATORY_CARE_PROVIDER_SITE_OTHER): Payer: Medicaid Other

## 2017-04-30 ENCOUNTER — Ambulatory Visit (HOSPITAL_COMMUNITY)
Admission: EM | Admit: 2017-04-30 | Discharge: 2017-04-30 | Disposition: A | Discharge status: Home / Self Care | Payer: Medicaid Other | Attending: Family Medicine | Admitting: Family Medicine

## 2017-04-30 ENCOUNTER — Encounter (HOSPITAL_COMMUNITY): Payer: Self-pay | Admitting: Emergency Medicine

## 2017-04-30 DIAGNOSIS — S62339A Displaced fracture of neck of unspecified metacarpal bone, initial encounter for closed fracture: Secondary | ICD-10-CM

## 2017-04-30 DIAGNOSIS — S0083XA Contusion of other part of head, initial encounter: Secondary | ICD-10-CM

## 2017-04-30 DIAGNOSIS — R17 Unspecified jaundice: Secondary | ICD-10-CM | POA: Diagnosis not present

## 2017-04-30 LAB — POCT I-STAT, CHEM 8
BUN: 9 mg/dL (ref 6–20)
CHLORIDE: 103 mmol/L (ref 101–111)
CREATININE: 0.7 mg/dL (ref 0.44–1.00)
Calcium, Ion: 1.22 mmol/L (ref 1.15–1.40)
GLUCOSE: 117 mg/dL — AB (ref 65–99)
HCT: 42 % (ref 36.0–46.0)
Hemoglobin: 14.3 g/dL (ref 12.0–15.0)
Potassium: 3.7 mmol/L (ref 3.5–5.1)
Sodium: 141 mmol/L (ref 135–145)
TCO2: 28 mmol/L (ref 22–32)

## 2017-04-30 NOTE — Discharge Instructions (Signed)
Please make an appointment with your eye doctor for next week.  Also, make an appointment with the orthopedic doctors noted below for your hand fracture.

## 2017-04-30 NOTE — ED Notes (Signed)
Ortho aware for need of ulnar gutter splint

## 2017-04-30 NOTE — ED Provider Notes (Signed)
[facility]   [id number] 04/30/17 Arrival Time: 1048   SUBJECTIVE:  Melanie French is a 61 y.o. female who presents to the urgent care with complaint of a fall 2 days ago... Tripped and fell onto a brick wall hitting her head and inj her right hand  Pt has a hematoma above right eye and right hand is swelling, bruised and mult abrasions  Reports EMS came to her house that night and told her to f/u but did not  Patient is a known diabetic.  She has not had her eyes checked in two years.  No diplopia but she feels she may have lost some vision after the fall.     Past Medical History:  Diagnosis Date  . Diabetes mellitus without complication (HCC)   . Hypertension    History reviewed. No pertinent family history. Social History   Social History  . Marital status: Divorced    Spouse name: N/A  . Number of children: N/A  . Years of education: N/A   Occupational History  . Not on file.   Social History Main Topics  . Smoking status: Never Smoker  . Smokeless tobacco: Never Used  . Alcohol use Not on file  . Drug use: Unknown  . Sexual activity: Not on file   Other Topics Concern  . Not on file   Social History Narrative  . No narrative on file   Current Meds  Medication Sig  . amLODipine-atorvastatin (CADUET) 5-10 MG tablet Take 1 tablet by mouth daily.  Marland Kitchen lisinopril (PRINIVIL,ZESTRIL) 5 MG tablet Take 10 mg by mouth daily.  . metformin (FORTAMET) 500 MG (OSM) 24 hr tablet Take 500 mg by mouth daily with breakfast.   No Known Allergies    ROS: As per HPI, remainder of ROS negative.   OBJECTIVE:   Vitals:   04/30/17 1059  BP: (!) 156/92  Pulse: 86  Resp: 20  Temp: 98.4 F (36.9 C)  TempSrc: Oral  SpO2: 100%     General appearance: alert; no distress Eyes: PERRL; EOMI; conjunctiva icteric;  Supraorbital ecchymosis and swelling,  Some diabetic atrophy in fundus without hemorrhages. HENT: normocephalic; atraumatic; TMs normal, canal  normal, external ears normal without trauma; nasal mucosa normal; oral mucosa normal Neck: supple Lungs: clear to auscultation bilaterally Heart: regular rate and rhythm Abdomen: soft, non-tender; bowel sounds normal; no masses or organomegaly; no guarding or rebound tenderness Back: no CVA tenderness Extremities: no cyanosis or edema; symmetrical with no gross deformities; ecchymotic right dorsal hand with 5th metacarpal tenderness. Skin: warm and dry; multiple abrasions on right knuckles Neurologic: normal gait; grossly normal Psychological: alert and cooperative; normal mood and affect      Labs:  Results for orders placed or performed during the hospital encounter of 04/30/17  I-STAT, chem 8  Result Value Ref Range   Sodium 141 135 - 145 mmol/L   Potassium 3.7 3.5 - 5.1 mmol/L   Chloride 103 101 - 111 mmol/L   BUN 9 6 - 20 mg/dL   Creatinine, Ser 7.82 0.44 - 1.00 mg/dL   Glucose, Bld 956 (H) 65 - 99 mg/dL   Calcium, Ion 2.13 0.86 - 1.40 mmol/L   TCO2 28 22 - 32 mmol/L   Hemoglobin 14.3 12.0 - 15.0 g/dL   HCT 57.8 46.9 - 62.9 %    Labs Reviewed  POCT I-STAT, CHEM 8 - Abnormal; Notable for the following:       Result Value   Glucose, Bld 117 (*)  All other components within normal limits    Dg Hand Complete Right  Result Date: 04/30/2017 CLINICAL DATA:  Right hand injury.  Swelling, pain EXAM: RIGHT HAND - COMPLETE 3+ VIEW COMPARISON:  09/09/2016 FINDINGS: There is a nondisplaced oblique fracture through the midportion of the right fifth metacarpal. No additional fracture. No subluxation or dislocation. IMPRESSION: Nondisplaced right fifth metacarpal fracture. Electronically Signed   By: Charlett Nose M.D.   On: 04/30/2017 11:29       ASSESSMENT & PLAN:  1. Closed boxer's fracture, initial encounter   2. Facial contusion, initial encounter   3. Scleral icterus     No orders of the defined types were placed in this encounter.   Reviewed expectations re:  course of current medical issues. Questions answered. Outlined signs and symptoms indicating need for more acute intervention. Patient verbalized understanding. After Visit Summary given.    Procedures:      Elvina Sidle, MD 04/30/17 1231

## 2017-04-30 NOTE — ED Triage Notes (Signed)
Pt reports she sustained a fall 2 days ago... Tripped and fell onto a brick wall hitting her head and inj her right hand  Pt has a hematoma above right eye and right hand is swelling, bruised and mult abrasions  Reports EMS came to her house that night and told her to f/u but did not  Denies LOC  Pt is A&O x4... NAD... Ambulatory

## 2017-05-03 ENCOUNTER — Other Ambulatory Visit: Payer: Self-pay | Admitting: Internal Medicine

## 2017-05-03 DIAGNOSIS — Z1231 Encounter for screening mammogram for malignant neoplasm of breast: Secondary | ICD-10-CM

## 2017-05-20 ENCOUNTER — Ambulatory Visit: Payer: Medicaid Other

## 2017-06-19 ENCOUNTER — Emergency Department (HOSPITAL_COMMUNITY)
Admission: EM | Admit: 2017-06-19 | Discharge: 2017-06-19 | Disposition: A | Discharge status: Home / Self Care | Payer: Medicaid Other | Attending: Emergency Medicine | Admitting: Emergency Medicine

## 2017-06-19 DIAGNOSIS — R1084 Generalized abdominal pain: Secondary | ICD-10-CM | POA: Diagnosis not present

## 2017-06-19 DIAGNOSIS — R112 Nausea with vomiting, unspecified: Secondary | ICD-10-CM | POA: Diagnosis not present

## 2017-06-19 DIAGNOSIS — Z79899 Other long term (current) drug therapy: Secondary | ICD-10-CM | POA: Diagnosis not present

## 2017-06-19 DIAGNOSIS — I10 Essential (primary) hypertension: Secondary | ICD-10-CM | POA: Diagnosis not present

## 2017-06-19 DIAGNOSIS — Z7984 Long term (current) use of oral hypoglycemic drugs: Secondary | ICD-10-CM | POA: Diagnosis not present

## 2017-06-19 DIAGNOSIS — E119 Type 2 diabetes mellitus without complications: Secondary | ICD-10-CM | POA: Diagnosis not present

## 2017-06-19 DIAGNOSIS — R197 Diarrhea, unspecified: Secondary | ICD-10-CM

## 2017-06-19 LAB — CBC
HEMATOCRIT: 39.3 % (ref 36.0–46.0)
Hemoglobin: 12.8 g/dL (ref 12.0–15.0)
MCH: 26.2 pg (ref 26.0–34.0)
MCHC: 32.6 g/dL (ref 30.0–36.0)
MCV: 80.4 fL (ref 78.0–100.0)
PLATELETS: 205 10*3/uL (ref 150–400)
RBC: 4.89 MIL/uL (ref 3.87–5.11)
RDW: 15.5 % (ref 11.5–15.5)
WBC: 6.7 10*3/uL (ref 4.0–10.5)

## 2017-06-19 LAB — COMPREHENSIVE METABOLIC PANEL
ALBUMIN: 4 g/dL (ref 3.5–5.0)
ALT: 17 U/L (ref 14–54)
AST: 27 U/L (ref 15–41)
Alkaline Phosphatase: 75 U/L (ref 38–126)
Anion gap: 8 (ref 5–15)
BILIRUBIN TOTAL: 0.6 mg/dL (ref 0.3–1.2)
BUN: 13 mg/dL (ref 6–20)
CHLORIDE: 108 mmol/L (ref 101–111)
CO2: 21 mmol/L — ABNORMAL LOW (ref 22–32)
CREATININE: 0.76 mg/dL (ref 0.44–1.00)
Calcium: 9.4 mg/dL (ref 8.9–10.3)
GFR calc Af Amer: >60 mL/min (ref >60)
GFR calc non Af Amer: >60 mL/min (ref >60)
GLUCOSE: 195 mg/dL — AB (ref 65–99)
POTASSIUM: 4.3 mmol/L (ref 3.5–5.1)
Sodium: 137 mmol/L (ref 135–145)
Total Protein: 8 g/dL (ref 6.5–8.1)

## 2017-06-19 LAB — URINALYSIS, ROUTINE W REFLEX MICROSCOPIC
Bilirubin Urine: NEGATIVE
Glucose, UA: NEGATIVE mg/dL
Hgb urine dipstick: NEGATIVE
Ketones, ur: NEGATIVE mg/dL
Leukocytes, UA: NEGATIVE
Nitrite: NEGATIVE
PH: 5 (ref 5.0–8.0)
Protein, ur: 30 mg/dL — AB
SPECIFIC GRAVITY, URINE: 1.024 (ref 1.005–1.030)

## 2017-06-19 LAB — LIPASE, BLOOD: LIPASE: 30 U/L (ref 11–51)

## 2017-06-19 LAB — CBG MONITORING, ED: GLUCOSE-CAPILLARY: 178 mg/dL — AB (ref 65–99)

## 2017-06-19 MED ORDER — ONDANSETRON 4 MG PO TBDP: 4.0000 mg | ORAL_TABLET | ORAL | 0 refills | Status: AC | PRN

## 2017-06-19 MED ORDER — DICYCLOMINE HCL 20 MG PO TABS: 20.0000 mg | ORAL_TABLET | Freq: Two times a day (BID) | ORAL | 0 refills | Status: AC

## 2017-06-19 NOTE — ED Triage Notes (Signed)
[facility]- pt coming from home with complaint of nausea and vomiting sudden onset. Pt reports she has not eaten since yesterday. Pain in all 4 quadrants of her abdomen. 160/100. Pt alert and oriented X4. 4mg  of zofran IV. 20g IV LAC.

## 2017-06-19 NOTE — ED Notes (Signed)
CBG 178 

## 2017-06-19 NOTE — ED Notes (Signed)
Pt ambulatory to restroom with steady gait.

## 2017-06-19 NOTE — ED Provider Notes (Signed)
[facility] Provider Note   CSN: [id number] Arrival date & time: 06/19/17  1124     History   Chief Complaint Chief Complaint  Patient presents with  . Emesis    HPI Melanie French is a 61 y.o. female.  HPI Patient was feeling well this morning.  She got up and made a couple coffee and sat down to drink it.  After drinking some coffee she quite quickly started to get intense lower abdominal pain.  She immediately went into the bathroom and had multiple episodes of diarrhea and then vomiting.  He continued to have severe abdominal pain that was diffuse.  After multiple episodes of vomiting and diarrhea she called EMS.  Was given Zofran.  After arrival to the emergency department pain began to abate and resolved.  At this time, she reports she no longer has abdominal pain and has not had any further vomiting or diarrhea.  She reports yesterday [facility] and had not eaten since that time.  She did take a church companion to medical facilities yesterday (her church pastor has pancreatic cancer and was getting some type of pain control procedure) and was possibly exposed to illness at that time. Past Medical History:  Diagnosis Date  . Diabetes mellitus without complication (HCC)   . Hypertension     Patient Active Problem List   Diagnosis Date Noted  . LEG CRAMPS, NOCTURNAL 05/24/2009  . HYPERCHOLESTEROLEMIA 04/15/2009  . ANXIETY DEPRESSION 04/15/2009  . CALLUSES, FEET, BILATERAL 04/15/2009  . LOW BACK PAIN, ACUTE 04/15/2009  . ABDOMINAL BLOATING 04/15/2009  . LIVER FUNCTION TESTS, ABNORMAL, HX OF 04/15/2009  . CHLAMYDIAL INFECTION 10/16/2008  . PERIPHERAL NEUROPATHY, FEET 07/26/2008  . VITILIGO 07/26/2008  . PULMONARY NODULE, RIGHT UPPER LOBE 06/25/2008  . BIPOLAR AFFECTIVE DISORDER 07/20/2006  . DIABETES MELLITUS 07/20/1998  . HYPERTENSION, BENIGN ESSENTIAL 07/20/1998    No past surgical history on  file.  OB History    No data available       Home Medications    Prior to Admission medications   Medication Sig Start Date End Date Taking? Authorizing Provider  amLODipine-atorvastatin (CADUET) 5-10 MG tablet Take 1 tablet by mouth daily.    [provider]  dicyclomine (BENTYL) 20 MG tablet Take 1 tablet (20 mg total) by mouth 2 (two) times daily. 06/19/17   Arby BarrettePfeiffer, Rivers Gassmann, MD  gabapentin (NEURONTIN) 400 MG capsule Take 400 mg by mouth 3 (three) times daily.    [provider]  lisinopril (PRINIVIL,ZESTRIL) 5 MG tablet Take 10 mg by mouth daily.    [provider]  metformin (FORTAMET) 500 MG (OSM) 24 hr tablet Take 500 mg by mouth daily with breakfast.    [provider]  naproxen (NAPROSYN) 500 MG tablet Take 1 tablet (500 mg total) by mouth 2 (two) times daily with a meal. 09/09/16   [place], Anselm PancoastWilliam J, FNP  ondansetron (ZOFRAN ODT) 4 MG disintegrating tablet Take 1 tablet (4 mg total) by mouth every 4 (four) hours as needed for nausea or vomiting. 06/19/17   Arby BarrettePfeiffer, Sudais Banghart, MD    Family History No family history on file.  Social History Social History   Tobacco Use  . Smoking status: Never Smoker  . Smokeless tobacco: Never Used  Substance Use Topics  . Alcohol use: Not on file  . Drug use: Not on file     Allergies   Patient has no known allergies.  Review of Systems Review of Systems   Physical Exam Updated Vital Signs BP 129/87   Pulse 65   Temp 98 F (36.7 C)   Resp 16   SpO2 99%   Physical Exam   ED Treatments / Results  Labs (all labs ordered are listed, but only abnormal results are displayed) Labs Reviewed  COMPREHENSIVE METABOLIC PANEL - Abnormal; Notable for the following components:      Result Value   CO2 21 (*)    Glucose, Bld 195 (*)    All other components within normal limits  URINALYSIS, ROUTINE W REFLEX MICROSCOPIC - Abnormal; Notable for the following components:   Protein, ur 30 (*)     Bacteria, UA RARE (*)    Squamous Epithelial / LPF 0-5 (*)    All other components within normal limits  CBG MONITORING, ED - Abnormal; Notable for the following components:   Glucose-Capillary 178 (*)    All other components within normal limits  LIPASE, BLOOD  CBC    EKG  EKG Interpretation None       Radiology No results found.  Procedures Procedures (including critical care time)  Medications Ordered in ED Medications - No data to display   Initial Impression / Assessment and Plan / ED Course  I have reviewed the triage vital signs and the nursing notes.  Pertinent labs & imaging results that were available during my care of the patient were reviewed by me and considered in my medical decision making (see chart for details).     Final Clinical Impressions(s) / ED Diagnoses   Final diagnoses:  Nausea vomiting and diarrhea  Generalized abdominal pain     At this time she has no further abdominal pain and vomiting and diarrhea resolved prior to arrival.  There have been no repeat episodes.  Labs do not show significant anomaly.  Patient is counseled on return precautions.  I reviewed differential diagnosis of possible viral etiology versus functional etiology such as diabetic gastroparesis, food related GI distress and remote consideration of ischemic bowel type presentation.  Patient is well aware that if she has recurrence of severe pain she is to return to the emergency department for reassessment. ED Discharge Orders        Ordered    ondansetron (ZOFRAN ODT) 4 MG disintegrating tablet  Every 4 hours PRN     06/19/17 1528    dicyclomine (BENTYL) 20 MG tablet  2 times daily     06/19/17 1528       Arby BarrettePfeiffer, Sephiroth Mcluckie, MD 06/20/17 1752

## 2017-06-30 ENCOUNTER — Ambulatory Visit: Payer: Medicaid Other

## 2018-01-18 DIAGNOSIS — E782 Mixed hyperlipidemia: Secondary | ICD-10-CM | POA: Insufficient documentation

## 2018-01-18 DIAGNOSIS — H2513 Age-related nuclear cataract, bilateral: Secondary | ICD-10-CM | POA: Insufficient documentation

## 2018-03-17 IMAGING — DX DG WRIST COMPLETE 3+V*R*
4 series · 4 of 4 positions shown · non-contrast
Comparison: None.

CLINICAL DATA: Fall onto outstretched hand today. Right wrist pain
and bruising. Initial encounter.

EXAM:
RIGHT WRIST - COMPLETE 3+ VIEW

[wrist pa]
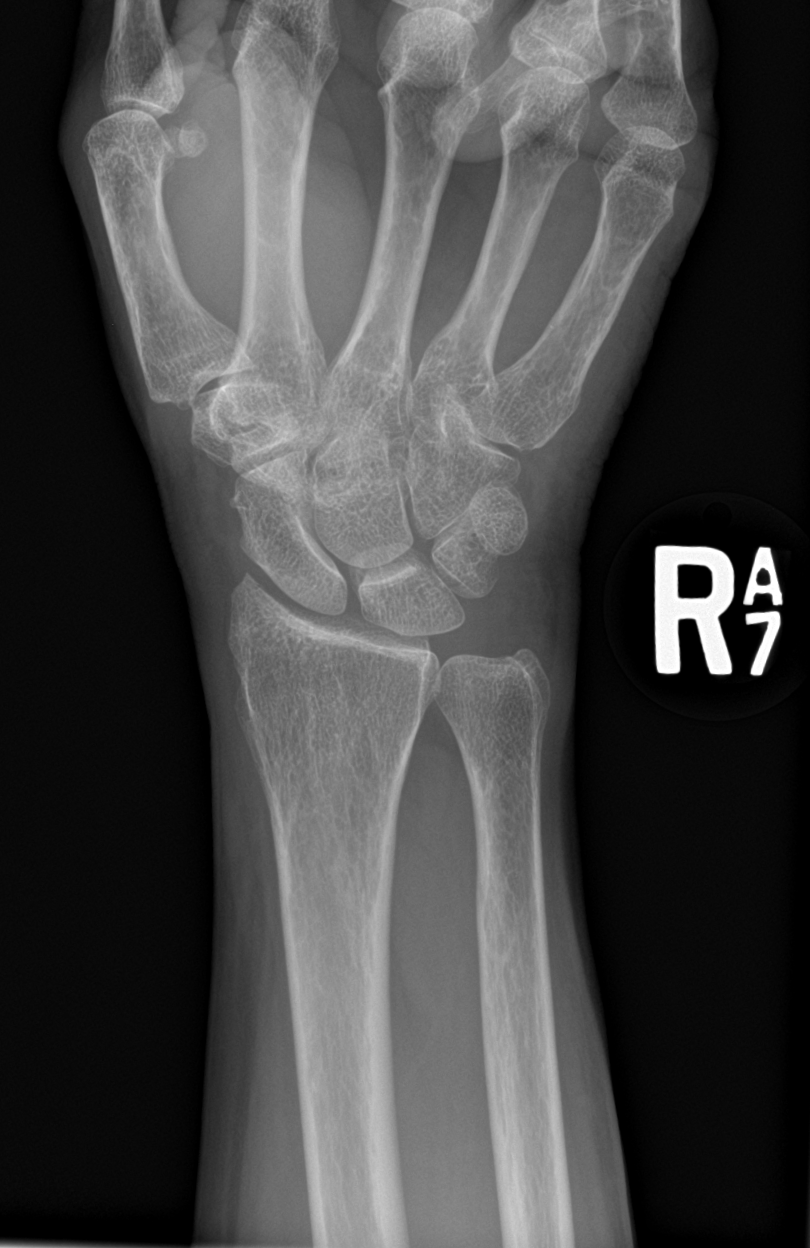

[wrist navicular]
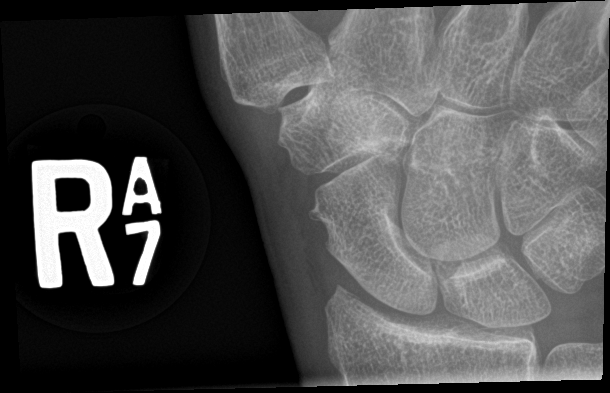

[wrist obl]
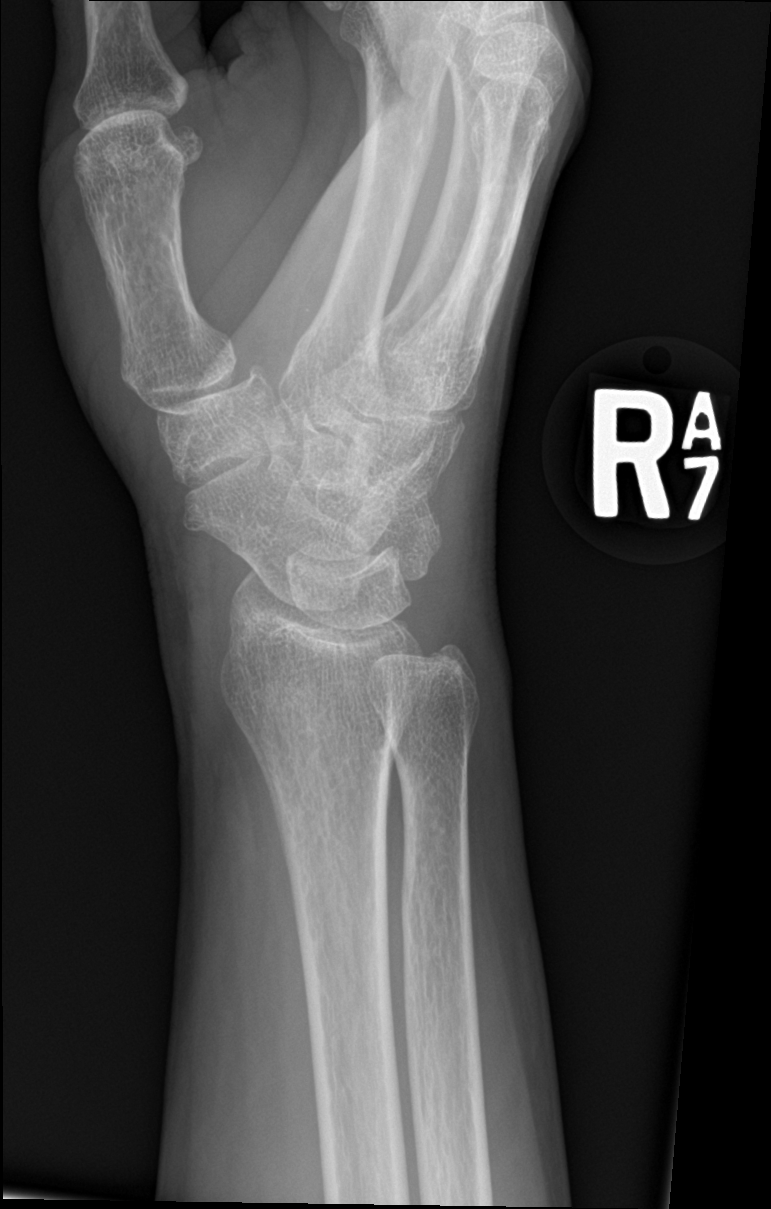

[wrist lat]
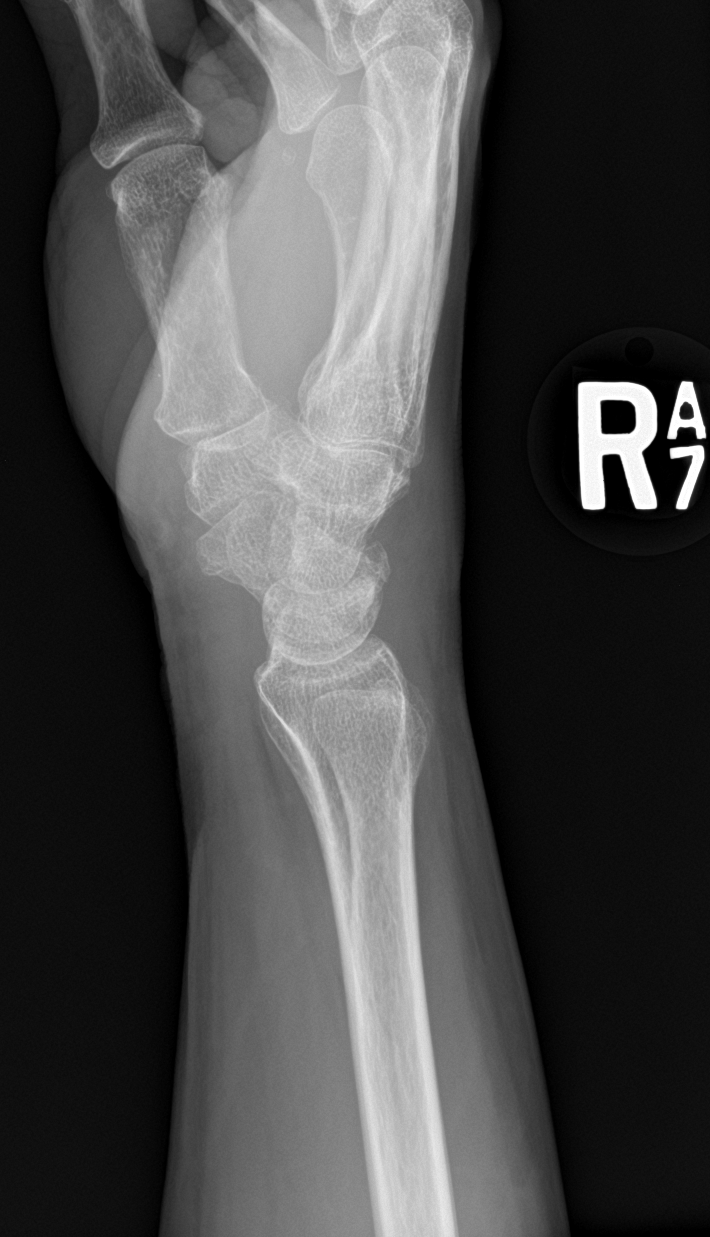

[4 of 4 positions shown; findings below may reference images not displayed]

FINDINGS: There is no evidence of fracture or dislocation. There is no
evidence of arthropathy or other focal bone abnormality. Soft
tissues are unremarkable.
IMPRESSION: Negative.

## 2019-09-27 ENCOUNTER — Other Ambulatory Visit: Payer: Self-pay | Admitting: Internal Medicine

## 2019-09-27 DIAGNOSIS — E2839 Other primary ovarian failure: Secondary | ICD-10-CM

## 2019-10-23 ENCOUNTER — Other Ambulatory Visit: Payer: Self-pay | Admitting: Internal Medicine

## 2019-10-23 DIAGNOSIS — Z1231 Encounter for screening mammogram for malignant neoplasm of breast: Secondary | ICD-10-CM

## 2020-01-01 ENCOUNTER — Ambulatory Visit
Admission: RE | Admit: 2020-01-01 | Discharge: 2020-01-01 | Disposition: A | Discharge status: Home / Self Care | Payer: Medicaid Other | Source: Ambulatory Visit | Attending: Internal Medicine | Admitting: Internal Medicine

## 2020-01-01 ENCOUNTER — Other Ambulatory Visit: Payer: Self-pay

## 2020-01-01 DIAGNOSIS — Z1231 Encounter for screening mammogram for malignant neoplasm of breast: Secondary | ICD-10-CM

## 2020-01-01 DIAGNOSIS — E2839 Other primary ovarian failure: Secondary | ICD-10-CM

## 2020-01-03 ENCOUNTER — Other Ambulatory Visit: Payer: Self-pay

## 2020-01-03 ENCOUNTER — Ambulatory Visit (INDEPENDENT_AMBULATORY_CARE_PROVIDER_SITE_OTHER): Payer: Medicare Other | Admitting: Podiatrist

## 2020-01-03 ENCOUNTER — Encounter: Payer: Self-pay | Admitting: Podiatrist

## 2020-01-03 DIAGNOSIS — L851 Acquired keratosis [keratoderma] palmaris et plantaris: Secondary | ICD-10-CM

## 2020-01-03 DIAGNOSIS — E0843 Diabetes mellitus due to underlying condition with diabetic autonomic (poly)neuropathy: Secondary | ICD-10-CM

## 2020-01-03 NOTE — Patient Instructions (Signed)
We will get the paperwork going for the diabetic shoes.  Call if the lesion is not improved and we will see you again in 3 months for follow up

## 2020-01-03 NOTE — Progress Notes (Signed)
   Chief Complaint  Patient presents with  . Callouses    Right plantar forefoot sub 2/3 painful callous. Pt states it has worsened over past 2 months.     HPI: Patient is 64 y.o. female who presents today for the concerns as listed above. The patient is diabetic and has loss of sensation to her feet. She also has a painful callus on the right foot that has gotten worse over the last 2 months.     Review of Systems No fevers, chills, nausea, muscle aches, no difficulty breathing, no calf pain, no chest pain or shortness of breath.   Physical Exam  GENERAL APPEARANCE: Alert, conversant. Appropriately groomed. No acute distress.   VASCULAR: Pedal pulses palpable DP and PT bilateral.  Capillary refill time is immediate to all digits,  Proximal to distal cooling it warm to warm.  Digital perfusion adequate.   NEUROLOGIC: sensation is decreased protectively to 5.07 monofilament at 2/5 sites bilateral.  Light touch is intact bilateral, vibratory sensation intact bilateral, achilles tendon reflex is intact bilateral.   MUSCULOSKELETAL: acceptable muscle strength, tone and stability bilateral.  No gross boney pedal deformities noted.  No pain, crepitus or limitation noted with foot and ankle range of motion bilateral. Prominent and plantarflexed 3rd metatarsal is noted on the right foot.  DERMATOLOGIC: skin is warm, supple, and dry.  No open lesions noted.  No rash, no pre ulcerative lesions. Large porokeratotic lesion is present submet 3 of the right foot.  Intact dermis is present post debridement.       Assessment   Diabetes mellitus due to underlying condition with diabetic autonomic neuropathy, unspecified whether long term insulin use (HCC)  Acquired plantar porokeratosis    Plan  Discussed treatment options. I pared the lesion with a 15 blade without complication .  Discussed the positive long term benefits of orthotics to provide offloading to this area and to help prevent  ulceration due to diabetes with neuropathy.  We will start the process of Diabetic shoes.  She will return in 3 months for preventive at risk foot care.

## 2020-01-12 ENCOUNTER — Ambulatory Visit: Payer: Medicare Other | Admitting: Orthotics

## 2020-01-12 ENCOUNTER — Other Ambulatory Visit: Payer: Self-pay

## 2020-03-27 ENCOUNTER — Ambulatory Visit: Payer: Medicare Other | Admitting: Orthotics

## 2020-04-01 ENCOUNTER — Ambulatory Visit: Payer: Medicare Other | Admitting: Podiatrist

## 2021-09-08 ENCOUNTER — Other Ambulatory Visit: Payer: Self-pay | Admitting: Internal Medicine

## 2021-09-09 LAB — CBC
HCT: 38.6 % (ref 35.0–45.0)
Hemoglobin: 12.6 g/dL (ref 11.7–15.5)
MCH: 27.6 pg (ref 27.0–33.0)
MCHC: 32.6 g/dL (ref 32.0–36.0)
MCV: 84.6 fL (ref 80.0–100.0)
MPV: 10.8 fL (ref 7.5–12.5)
Platelets: 233 10*3/uL (ref 140–400)
RBC: 4.56 10*6/uL (ref 3.80–5.10)
RDW: 13.7 % (ref 11.0–15.0)
WBC: 3.9 10*3/uL (ref 3.8–10.8)

## 2021-09-09 LAB — LIPID PANEL
Cholesterol: 199 mg/dL (ref <200)
HDL: 56 mg/dL (ref >=50)
LDL Cholesterol (Calc): 121 mg/dL (calc) — ABNORMAL HIGH
Non-HDL Cholesterol (Calc): 143 mg/dL (calc) — ABNORMAL HIGH (ref <130)
Total CHOL/HDL Ratio: 3.6 (calc) (ref <5.0)
Triglycerides: 116 mg/dL (ref <150)

## 2021-09-09 LAB — COMPLETE METABOLIC PANEL WITH GFR
AG Ratio: 1.2 (calc) (ref 1.0–2.5)
ALT: 17 U/L (ref 6–29)
AST: 25 U/L (ref 10–35)
Albumin: 4.1 g/dL (ref 3.6–5.1)
Alkaline phosphatase (APISO): 63 U/L (ref 37–153)
BUN: 12 mg/dL (ref 7–25)
CO2: 25 mmol/L (ref 20–32)
Calcium: 9.9 mg/dL (ref 8.6–10.4)
Chloride: 104 mmol/L (ref 98–110)
Creat: 0.7 mg/dL (ref 0.50–1.05)
Globulin: 3.5 g/dL (calc) (ref 1.9–3.7)
Glucose, Bld: 87 mg/dL (ref 65–99)
Potassium: 4.1 mmol/L (ref 3.5–5.3)
Sodium: 140 mmol/L (ref 135–146)
Total Bilirubin: 0.4 mg/dL (ref 0.2–1.2)
Total Protein: 7.6 g/dL (ref 6.1–8.1)
eGFR: 95 mL/min/{1.73_m2} (ref >=60)

## 2021-09-09 LAB — VITAMIN D 25 HYDROXY (VIT D DEFICIENCY, FRACTURES): Vit D, 25-Hydroxy: 39 ng/mL (ref 30–100)

## 2021-09-09 LAB — TSH: TSH: 4.25 mIU/L (ref 0.40–4.50)

## 2021-11-13 ENCOUNTER — Other Ambulatory Visit: Payer: Self-pay | Admitting: Internal Medicine

## 2021-11-13 DIAGNOSIS — Z1231 Encounter for screening mammogram for malignant neoplasm of breast: Secondary | ICD-10-CM

## 2021-12-03 ENCOUNTER — Ambulatory Visit
Admission: RE | Admit: 2021-12-03 | Discharge: 2021-12-03 | Disposition: A | Discharge status: Home / Self Care | Payer: Medicare Other | Source: Ambulatory Visit | Attending: Internal Medicine | Admitting: Internal Medicine

## 2021-12-03 DIAGNOSIS — Z1231 Encounter for screening mammogram for malignant neoplasm of breast: Secondary | ICD-10-CM

## 2021-12-04 ENCOUNTER — Other Ambulatory Visit: Payer: Self-pay | Admitting: Internal Medicine

## 2021-12-04 DIAGNOSIS — R928 Other abnormal and inconclusive findings on diagnostic imaging of breast: Secondary | ICD-10-CM

## 2021-12-10 ENCOUNTER — Other Ambulatory Visit: Payer: Self-pay | Admitting: Internal Medicine

## 2021-12-10 ENCOUNTER — Ambulatory Visit
Admission: RE | Admit: 2021-12-10 | Discharge: 2021-12-10 | Disposition: A | Discharge status: Home / Self Care | Payer: Medicare Other | Source: Ambulatory Visit | Attending: Internal Medicine | Admitting: Internal Medicine

## 2021-12-10 DIAGNOSIS — R928 Other abnormal and inconclusive findings on diagnostic imaging of breast: Secondary | ICD-10-CM

## 2021-12-10 DIAGNOSIS — R921 Mammographic calcification found on diagnostic imaging of breast: Secondary | ICD-10-CM

## 2021-12-22 ENCOUNTER — Inpatient Hospital Stay: Admission: RE | Admit: 2021-12-22 | Payer: Medicare Other | Source: Ambulatory Visit

## 2022-04-21 ENCOUNTER — Ambulatory Visit (HOSPITAL_COMMUNITY): Payer: Self-pay

## 2022-08-28 ENCOUNTER — Ambulatory Visit (HOSPITAL_COMMUNITY)
Admission: EM | Admit: 2022-08-28 | Discharge: 2022-08-28 | Disposition: A | Discharge status: Home / Self Care | Payer: 59 | Attending: Internal Medicine | Admitting: Internal Medicine

## 2022-08-28 ENCOUNTER — Ambulatory Visit (INDEPENDENT_AMBULATORY_CARE_PROVIDER_SITE_OTHER): Payer: 59

## 2022-08-28 ENCOUNTER — Encounter (HOSPITAL_COMMUNITY): Payer: Self-pay

## 2022-08-28 DIAGNOSIS — M5412 Radiculopathy, cervical region: Secondary | ICD-10-CM

## 2022-08-28 DIAGNOSIS — I10 Essential (primary) hypertension: Secondary | ICD-10-CM

## 2022-08-28 DIAGNOSIS — M5030 Other cervical disc degeneration, unspecified cervical region: Secondary | ICD-10-CM

## 2022-08-28 DIAGNOSIS — M542 Cervicalgia: Secondary | ICD-10-CM | POA: Diagnosis not present

## 2022-08-28 MED ORDER — NAPROXEN 500 MG PO TABS: 500.0000 mg | ORAL_TABLET | Freq: Two times a day (BID) | ORAL | 0 refills | Status: AC

## 2022-08-28 MED ORDER — BACLOFEN 5 MG PO TABS: 5.0000 mg | ORAL_TABLET | Freq: Two times a day (BID) | ORAL | 0 refills | Status: AC | PRN

## 2022-08-28 NOTE — ED Provider Notes (Signed)
[place]    CSN: [id number] Arrival date & time: 08/28/22  1027      History   Chief Complaint Chief Complaint  Patient presents with   arm tingling    HPI Melanie French is a 67 y.o. female.   Patient presents today with a prolonged history of left shoulder/neck pain.  She has been seen by her primary care who obtained an x-ray of the shoulder that showed arthritis.  Over the past 10 days she has had intermittent numbness and paresthesias in the left arm.  Reports that these episodes are intermittent without identifiable trigger and generally last for a few seconds at a time before resolving without intervention.  She has tried some ibuprofen without improvement of symptoms.  She is right-handed.  Denies any known injury or increase in activity prior to symptom onset.  She denies previous injury or surgery involving her neck.  She has not had an MRI of the neck in the past.  She denies any chest pain shortness of breath, lightheadedness, weakness, nausea/vomiting.    Past Medical History:  Diagnosis Date   Diabetes mellitus without complication ([place])    Hypertension     Patient Active Problem List   Diagnosis Date Noted   Age-related nuclear cataract of both eyes 01/18/2018   Mixed hyperlipidemia 01/18/2018   LEG CRAMPS, NOCTURNAL 05/24/2009   HYPERCHOLESTEROLEMIA 04/15/2009   ANXIETY DEPRESSION 04/15/2009   CALLUSES, FEET, BILATERAL 04/15/2009   LOW BACK PAIN, ACUTE 04/15/2009   ABDOMINAL BLOATING 04/15/2009   LIVER FUNCTION TESTS, ABNORMAL, HX OF 04/15/2009   CHLAMYDIAL INFECTION 10/16/2008   PERIPHERAL NEUROPATHY, FEET 07/26/2008   VITILIGO 07/26/2008   PULMONARY NODULE, RIGHT UPPER LOBE 06/25/2008   BIPOLAR AFFECTIVE DISORDER 07/20/2006   DIABETES MELLITUS 07/20/1998   HYPERTENSION, BENIGN ESSENTIAL 07/20/1998    History reviewed. No pertinent surgical history.  OB History   No obstetric history on file.      Home Medications    Prior to  Admission medications   Medication Sig Start Date End Date Taking? Authorizing Provider  Baclofen 5 MG TABS Take 1 tablet (5 mg total) by mouth 2 (two) times daily as needed. 08/28/22  Yes Donovan Persley K, PA-C  amLODipine (NORVASC) 10 MG tablet Take 10 mg by mouth daily. 11/03/19   [provider]  amLODipine-atorvastatin (CADUET) 5-10 MG tablet Take 1 tablet by mouth daily.    [provider]  Blood Glucose Monitoring Suppl (GLUCOCOM BLOOD GLUCOSE MONITOR) DEVI Use as instructed: True Metrix 01/31/18   [provider]  cetirizine (ZYRTEC) 10 MG tablet Take 10 mg by mouth daily. 12/04/19   [provider]  dicyclomine (BENTYL) 20 MG tablet Take 1 tablet (20 mg total) by mouth 2 (two) times daily. Patient not taking: Reported on 01/03/2020 06/19/17   Charlesetta Shanks, MD  gabapentin (NEURONTIN) 400 MG capsule Take 400 mg by mouth 3 (three) times daily. Patient not taking: Reported on 01/03/2020    [provider]  hydrochlorothiazide (HYDRODIURIL) 12.5 MG tablet Take 12.5 mg by mouth daily. 11/10/19   [provider]  lisinopril (PRINIVIL,ZESTRIL) 5 MG tablet Take 10 mg by mouth daily.    [provider]  lisinopril (ZESTRIL) 40 MG tablet Take 40 mg by mouth daily. 12/30/19   [provider]  metformin (FORTAMET) 500 MG (OSM) 24 hr tablet Take 500 mg by mouth daily with breakfast.    [provider]  naproxen (NAPROSYN) 500 MG tablet Take 1 tablet (  500 mg total) by mouth 2 (two) times daily with a meal. 08/28/22   Hermina Barnard K, PA-C  ondansetron (ZOFRAN ODT) 4 MG disintegrating tablet Take 1 tablet (4 mg total) by mouth every 4 (four) hours as needed for nausea or vomiting. Patient not taking: Reported on 01/03/2020 06/19/17   Charlesetta Shanks, MD    Family History Family History  Problem Relation Age of Onset   Breast cancer Neg Hx     Social History Social History   Tobacco Use   Smoking status: Never   Smokeless  tobacco: Never  Substance Use Topics   Alcohol use: Not Currently   Drug use: Not Currently     Allergies   Patient has no known allergies.   Review of Systems Review of Systems  Constitutional:  Positive for activity change. Negative for appetite change, fatigue and fever.  Respiratory:  Negative for cough and shortness of breath.   Cardiovascular:  Negative for chest pain, palpitations and leg swelling.  Gastrointestinal:  Negative for abdominal pain, diarrhea, nausea and vomiting.  Musculoskeletal:  Positive for neck pain. Negative for arthralgias and myalgias.  Neurological:  Positive for numbness (Numbness/tingling in left arm). Negative for dizziness, weakness, light-headedness and headaches.     Physical Exam Triage Vital Signs ED Triage Vitals  Enc Vitals Group     BP 08/28/22 1035 (!) 154/96     Pulse Rate 08/28/22 1035 (!) 101     Resp 08/28/22 1035 18     Temp 08/28/22 1035 98.4 F (36.9 C)     Temp Source 08/28/22 1035 Oral     SpO2 08/28/22 1035 100 %     Weight --      Height --      Head Circumference --      Peak Flow --      Pain Score 08/28/22 1037 7     Pain Loc --      Pain Edu? --      Excl. in [place]? --    No data found.  Updated Vital Signs BP (S) (!) 151/83 (BP Location: Right Arm) Comment: states hasn't taken her meds today yest  Pulse 83   Temp 98.4 F (36.9 C) (Oral)   Resp 18   SpO2 98%   Visual Acuity Right Eye Distance:   Left Eye Distance:   Bilateral Distance:    Right Eye Near:   Left Eye Near:    Bilateral Near:     Physical Exam Vitals reviewed.  Constitutional:      General: She is awake. She is not in acute distress.    Appearance: Normal appearance. She is well-developed. She is not ill-appearing.     Comments: Very pleasant female appears stated age in no acute distress sitting comfortably in exam room  HENT:     Head: Normocephalic and atraumatic.  Cardiovascular:     Rate and Rhythm: Normal rate and regular  rhythm.     Heart sounds: Normal heart sounds, S1 normal and S2 normal. No murmur heard. Pulmonary:     Effort: Pulmonary effort is normal.     Breath sounds: Normal breath sounds. No wheezing, rhonchi or rales.     Comments: Clear to auscultation bilaterally Musculoskeletal:     Cervical back: Spasms and tenderness present. No bony tenderness. No pain with movement.     Thoracic back: No tenderness or bony tenderness.     Lumbar back: No tenderness or bony tenderness.  Comments: Neck: Tenderness palpation along left trapezius.  No pain percussion of vertebrae.  No deformity or step-off noted.  Decreased range of motion with rotation secondary to pain.  Strength 5/5 bilateral upper extremities.  Hands neurovascularly intact.  Psychiatric:        Behavior: Behavior is cooperative.      UC Treatments / Results  Labs (all labs ordered are listed, but only abnormal results are displayed) Labs Reviewed - No data to display  EKG   Radiology DG Cervical Spine Complete  Result Date: 08/28/2022 CLINICAL DATA:  Cervical radiculopathy. EXAM: CERVICAL SPINE - COMPLETE 4+ VIEW COMPARISON:  None Available. FINDINGS: Cervical spine is visualized to the level of C6-7. Vertebral body heights are maintained. 3 mm anterolisthesis of C4 on C5 secondary to facet disease. Prevertebral soft tissues are normal. No acute fracture. Degenerative disease with disc height loss at C5-6 and C6-7 and to lesser extent C4-5. Bilateral facet arthropathy at C2-3, C3-4, C4-5, C5-6 and C6-7. Right uncovertebral degenerative changes at C5-6 and C6-7 with foraminal narrowing. Left uncovertebral degenerative changes at C4-5 with foraminal narrowing. Mild uncovertebral degenerative changes at C5-6 and C6-7 without foraminal stenosis. IMPRESSION: 1. Cervical spine spondylosis as described above. 2. No acute osseous injury of the cervical spine. 3. 3 mm anterolisthesis of C4 on C5 secondary to facet disease. Electronically Signed    By: Kathreen Devoid M.D.   On: 08/28/2022 12:00    Procedures Procedures (including critical care time)  Medications Ordered in UC Medications - No data to display  Initial Impression / Assessment and Plan / UC Course  I have reviewed the triage vital signs and the nursing notes.  Pertinent labs & imaging results that were available during my care of the patient were reviewed by me and considered in my medical decision making (see chart for details).     Patient is well-appearing, afebrile, nontoxic, nontachycardic.  EKG was obtained in triage which showed normal sinus rhythm with ventricular rate of 85 bpm without ischemic changes; compared to Nov 19, 2009 tracing nonspecific ST changes in lead III otherwise no significant change.  X-ray of cervical spine was obtained that showed significant degeneration likely contributing to symptoms.  Will start anti-inflammatories (Naprosyn 500 mg twice daily).  Discussed that she is not to take NSAIDs with this medication due to risk of GI bleeding.  Can use acetaminophen/Tylenol for breakthrough pain.  Will start baclofen 5 mg up to twice a day.  Discussed that this can be sedating so she should not drive or drink alcohol while taking it.  She can use heat, rest, stretch for symptom relief.  If her symptoms are not improving, she will follow-up with orthopedic provider as she may benefit from physical therapy and/or MRI which we cannot arrange in urgent care.  She was given contact information for local provider with instruction to call to schedule an appointment as needed.  Discussed that if she has any worsening symptoms including increasing pain, fever, weakness, numbness, paresthesias he should be seen immediately.  Blood pressure is elevated but patient reports did not take her medication yet today.  Denies any current symptoms of endorgan damage.  She was encouraged to take her medication and monitor this at home.  If it remains elevated she is to return  for reevaluation.  Discussed that if she has any signs/symptoms of endorgan damage in the setting of high blood pressure.  Including chest pain, shortness of breath, headache, vision change, dizziness she should go to  the emergency room.  Final Clinical Impressions(s) / UC Diagnoses   Final diagnoses:  DDD (degenerative disc disease), cervical  Cervical radiculopathy  Elevated blood pressure reading in office with diagnosis of hypertension     Discharge Instructions      Your x-ray showed arthritis throughout your spine which is likely contributing to your pain; I suspect that the arthritis is causing pressure on the nerve leading to the numbness/tingling sensation in your arm.  Take Naprosyn to help with pain.  Do not take NSAIDs with this medication including aspirin, ibuprofen/Advil, naproxen/Aleve.  Take baclofen up to twice a day.  This will make you sleepy so do not drive or drink alcohol with taking it.  If your symptoms continue I recommend you follow-up with an orthopedic provider; call to schedule an appointment.  You may benefit from physical therapy and/or MRI which we cannot arrange in urgent care.  If you have any worsening or changing symptoms including severe pain, weakness, numbness/tingling that does not go away, fever you should be seen immediately.  Your blood pressure is elevated.  Take your medication to get home.  Monitor this at home and if you develop any chest pain, shortness of breath, headache, vision change, dizziness in the setting of high blood pressure you should be seen immediately.  Follow-up with your primary care within a week.     ED Prescriptions     Medication Sig Dispense Auth. Provider   naproxen (NAPROSYN) 500 MG tablet Take 1 tablet (500 mg total) by mouth 2 (two) times daily with a meal. 14 tablet Jeromiah Ohalloran K, PA-C   Baclofen 5 MG TABS Take 1 tablet (5 mg total) by mouth 2 (two) times daily as needed. 20 tablet Aeron Donaghey, Derry Skill, PA-C      PDMP  not reviewed this encounter.   Terrilee Croak, PA-C 08/28/22 1208

## 2022-08-28 NOTE — Discharge Instructions (Addendum)
Your x-ray showed arthritis throughout your spine which is likely contributing to your pain; I suspect that the arthritis is causing pressure on the nerve leading to the numbness/tingling sensation in your arm.  Take Naprosyn to help with pain.  Do not take NSAIDs with this medication including aspirin, ibuprofen/Advil, naproxen/Aleve.  Take baclofen up to twice a day.  This will make you sleepy so do not drive or drink alcohol with taking it.  If your symptoms continue I recommend you follow-up with an orthopedic provider; call to schedule an appointment.  You may benefit from physical therapy and/or MRI which we cannot arrange in urgent care.  If you have any worsening or changing symptoms including severe pain, weakness, numbness/tingling that does not go away, fever you should be seen immediately.  Your blood pressure is elevated.  Take your medication to get home.  Monitor this at home and if you develop any chest pain, shortness of breath, headache, vision change, dizziness in the setting of high blood pressure you should be seen immediately.  Follow-up with your primary care within a week.

## 2022-08-28 NOTE — ED Triage Notes (Signed)
Pt c/o lt arm tingling intermittent for 10 days. States prior to this has had lt shoulder pain for years. Denies chest pain. States called her PCP today and told to come here or ED. Took ibuprofen with some relief.

## 2023-01-13 LAB — COLOGUARD: COLOGUARD: NEGATIVE

## 2023-12-20 ENCOUNTER — Other Ambulatory Visit: Payer: Self-pay | Admitting: Internal Medicine

## 2023-12-20 DIAGNOSIS — R921 Mammographic calcification found on diagnostic imaging of breast: Secondary | ICD-10-CM

## 2024-01-13 ENCOUNTER — Encounter
